# Patient Record
Sex: Female | Born: 1950 | Race: White | Hispanic: No
Health system: Midwestern US, Community
[De-identification: ages and names within clinical notes are randomized; demographics above are authoritative.]

## PROBLEM LIST (undated history)

## (undated) DIAGNOSIS — M19011 Primary osteoarthritis, right shoulder: Secondary | ICD-10-CM

## (undated) DIAGNOSIS — R52 Pain, unspecified: Secondary | ICD-10-CM

## (undated) DIAGNOSIS — Z1231 Encounter for screening mammogram for malignant neoplasm of breast: Secondary | ICD-10-CM

## (undated) DIAGNOSIS — M810 Age-related osteoporosis without current pathological fracture: Secondary | ICD-10-CM

## (undated) DIAGNOSIS — G250 Essential tremor: Secondary | ICD-10-CM

## (undated) DIAGNOSIS — R251 Tremor, unspecified: Secondary | ICD-10-CM

## (undated) DIAGNOSIS — M797 Fibromyalgia: Secondary | ICD-10-CM

## (undated) DIAGNOSIS — B009 Herpesviral infection, unspecified: Secondary | ICD-10-CM

## (undated) DIAGNOSIS — F419 Anxiety disorder, unspecified: Secondary | ICD-10-CM

## (undated) DIAGNOSIS — F32A Depression, unspecified: Secondary | ICD-10-CM

## (undated) DIAGNOSIS — F329 Major depressive disorder, single episode, unspecified: Secondary | ICD-10-CM

## (undated) DIAGNOSIS — K5090 Crohn's disease, unspecified, without complications: Secondary | ICD-10-CM

## (undated) HISTORY — PX: COLON RESECTION: SHX5231

---

## 2009-02-15 LAB — HM DEXA SCAN

## 2009-02-15 LAB — HM MAMMOGRAPHY

## 2009-04-28 ENCOUNTER — Inpatient Hospital Stay (HOSPITAL_COMMUNITY): Admission: EM | Admit: 2009-04-28 | Discharge: 2009-05-09 | Payer: Self-pay | Admitting: Emergency Medicine

## 2009-04-29 ENCOUNTER — Encounter (INDEPENDENT_AMBULATORY_CARE_PROVIDER_SITE_OTHER): Payer: Self-pay | Admitting: Internal Medicine

## 2010-05-11 LAB — CBC
HCT: 27.1 % — ABNORMAL LOW (ref 36.0–46.0)
HCT: 27.8 % — ABNORMAL LOW (ref 36.0–46.0)
HCT: 28.9 % — ABNORMAL LOW (ref 36.0–46.0)
HCT: 29.5 % — ABNORMAL LOW (ref 36.0–46.0)
HCT: 31 % — ABNORMAL LOW (ref 36.0–46.0)
Hemoglobin: 10.8 g/dL — ABNORMAL LOW (ref 12.0–15.0)
Hemoglobin: 9.3 g/dL — ABNORMAL LOW (ref 12.0–15.0)
Hemoglobin: 9.5 g/dL — ABNORMAL LOW (ref 12.0–15.0)
Hemoglobin: 9.6 g/dL — ABNORMAL LOW (ref 12.0–15.0)
MCHC: 33.5 g/dL (ref 30.0–36.0)
MCHC: 34.1 g/dL (ref 30.0–36.0)
MCHC: 34.1 g/dL (ref 30.0–36.0)
MCHC: 34.3 g/dL (ref 30.0–36.0)
MCHC: 34.3 g/dL (ref 30.0–36.0)
MCHC: 34.4 g/dL (ref 30.0–36.0)
MCHC: 34.7 g/dL (ref 30.0–36.0)
MCHC: 34.9 g/dL (ref 30.0–36.0)
MCV: 85.3 fL (ref 78.0–100.0)
MCV: 85.3 fL (ref 78.0–100.0)
MCV: 85.8 fL (ref 78.0–100.0)
MCV: 86 fL (ref 78.0–100.0)
MCV: 86.5 fL (ref 78.0–100.0)
MCV: 86.5 fL (ref 78.0–100.0)
MCV: 86.6 fL (ref 78.0–100.0)
MCV: 86.6 fL (ref 78.0–100.0)
MCV: 87.7 fL (ref 78.0–100.0)
Platelets: 372 10*3/uL (ref 150–400)
Platelets: 496 10*3/uL — ABNORMAL HIGH (ref 150–400)
Platelets: 670 10*3/uL — ABNORMAL HIGH (ref 150–400)
Platelets: 670 10*3/uL — ABNORMAL HIGH (ref 150–400)
Platelets: 673 10*3/uL — ABNORMAL HIGH (ref 150–400)
Platelets: 685 10*3/uL — ABNORMAL HIGH (ref 150–400)
RBC: 3.2 MIL/uL — ABNORMAL LOW (ref 3.87–5.11)
RBC: 3.31 MIL/uL — ABNORMAL LOW (ref 3.87–5.11)
RBC: 3.34 MIL/uL — ABNORMAL LOW (ref 3.87–5.11)
RBC: 3.52 MIL/uL — ABNORMAL LOW (ref 3.87–5.11)
RBC: 3.62 MIL/uL — ABNORMAL LOW (ref 3.87–5.11)
RBC: 3.66 MIL/uL — ABNORMAL LOW (ref 3.87–5.11)
RDW: 13.7 % (ref 11.5–15.5)
RDW: 13.7 % (ref 11.5–15.5)
RDW: 13.9 % (ref 11.5–15.5)
RDW: 13.9 % (ref 11.5–15.5)
RDW: 13.9 % (ref 11.5–15.5)
RDW: 14 % (ref 11.5–15.5)
RDW: 14.1 % (ref 11.5–15.5)
RDW: 14.2 % (ref 11.5–15.5)
RDW: 14.3 % (ref 11.5–15.5)
WBC: 11.3 10*3/uL — ABNORMAL HIGH (ref 4.0–10.5)
WBC: 13.6 10*3/uL — ABNORMAL HIGH (ref 4.0–10.5)
WBC: 15.3 10*3/uL — ABNORMAL HIGH (ref 4.0–10.5)
WBC: 18.2 10*3/uL — ABNORMAL HIGH (ref 4.0–10.5)
WBC: 22.7 10*3/uL — ABNORMAL HIGH (ref 4.0–10.5)
WBC: 25.5 10*3/uL — ABNORMAL HIGH (ref 4.0–10.5)

## 2010-05-11 LAB — BASIC METABOLIC PANEL
BUN: 1 mg/dL — ABNORMAL LOW (ref 6–23)
BUN: 2 mg/dL — ABNORMAL LOW (ref 6–23)
BUN: 2 mg/dL — ABNORMAL LOW (ref 6–23)
BUN: 5 mg/dL — ABNORMAL LOW (ref 6–23)
BUN: 5 mg/dL — ABNORMAL LOW (ref 6–23)
BUN: 5 mg/dL — ABNORMAL LOW (ref 6–23)
CO2: 19 mEq/L (ref 19–32)
CO2: 22 mEq/L (ref 19–32)
CO2: 23 mEq/L (ref 19–32)
CO2: 23 mEq/L (ref 19–32)
CO2: 24 mEq/L (ref 19–32)
CO2: 24 mEq/L (ref 19–32)
CO2: 25 mEq/L (ref 19–32)
Calcium: 7.6 mg/dL — ABNORMAL LOW (ref 8.4–10.5)
Calcium: 8.3 mg/dL — ABNORMAL LOW (ref 8.4–10.5)
Calcium: 8.7 mg/dL (ref 8.4–10.5)
Calcium: 8.7 mg/dL (ref 8.4–10.5)
Chloride: 106 mEq/L (ref 96–112)
Chloride: 107 mEq/L (ref 96–112)
Chloride: 107 mEq/L (ref 96–112)
Chloride: 108 mEq/L (ref 96–112)
Chloride: 110 mEq/L (ref 96–112)
Creatinine, Ser: 0.64 mg/dL (ref 0.4–1.2)
Creatinine, Ser: 0.65 mg/dL (ref 0.4–1.2)
Creatinine, Ser: 0.72 mg/dL (ref 0.4–1.2)
Creatinine, Ser: 0.75 mg/dL (ref 0.4–1.2)
Creatinine, Ser: 0.95 mg/dL (ref 0.4–1.2)
GFR calc Af Amer: >60 mL/min (ref >60)
GFR calc Af Amer: >60 mL/min (ref >60)
GFR calc Af Amer: >60 mL/min (ref >60)
GFR calc Af Amer: >60 mL/min (ref >60)
GFR calc Af Amer: >60 mL/min (ref >60)
GFR calc Af Amer: >60 mL/min (ref >60)
GFR calc non Af Amer: >60 mL/min (ref >60)
GFR calc non Af Amer: >60 mL/min (ref >60)
GFR calc non Af Amer: >60 mL/min (ref >60)
GFR calc non Af Amer: >60 mL/min (ref >60)
GFR calc non Af Amer: >60 mL/min (ref >60)
Glucose, Bld: 80 mg/dL (ref 70–99)
Glucose, Bld: 95 mg/dL (ref 70–99)
Glucose, Bld: 96 mg/dL (ref 70–99)
Glucose, Bld: 99 mg/dL (ref 70–99)
Potassium: 3.6 mEq/L (ref 3.5–5.1)
Potassium: 3.7 mEq/L (ref 3.5–5.1)
Potassium: 3.8 mEq/L (ref 3.5–5.1)
Potassium: 4 mEq/L (ref 3.5–5.1)
Potassium: 4.1 mEq/L (ref 3.5–5.1)
Potassium: 5.2 mEq/L — ABNORMAL HIGH (ref 3.5–5.1)
Sodium: 137 mEq/L (ref 135–145)
Sodium: 138 mEq/L (ref 135–145)
Sodium: 140 mEq/L (ref 135–145)
Sodium: 141 mEq/L (ref 135–145)
Sodium: 141 mEq/L (ref 135–145)
Sodium: 142 mEq/L (ref 135–145)

## 2010-05-11 LAB — FERRITIN: Ferritin: 362 ng/mL — ABNORMAL HIGH (ref 10–291)

## 2010-05-11 LAB — DIFFERENTIAL
Basophils Absolute: 0 10*3/uL (ref 0.0–0.1)
Basophils Absolute: 0 10*3/uL (ref 0.0–0.1)
Basophils Absolute: 0 10*3/uL (ref 0.0–0.1)
Basophils Absolute: 0.1 10*3/uL (ref 0.0–0.1)
Basophils Absolute: 0.1 10*3/uL (ref 0.0–0.1)
Basophils Relative: 0 % (ref 0–1)
Basophils Relative: 0 % (ref 0–1)
Basophils Relative: 0 % (ref 0–1)
Basophils Relative: 0 % (ref 0–1)
Basophils Relative: 1 % (ref 0–1)
Eosinophils Absolute: 0 10*3/uL (ref 0.0–0.7)
Eosinophils Absolute: 0 10*3/uL (ref 0.0–0.7)
Eosinophils Absolute: 0.1 10*3/uL (ref 0.0–0.7)
Eosinophils Absolute: 0.2 10*3/uL (ref 0.0–0.7)
Eosinophils Absolute: 0.2 10*3/uL (ref 0.0–0.7)
Eosinophils Absolute: 0.3 10*3/uL (ref 0.0–0.7)
Eosinophils Absolute: 0.3 10*3/uL (ref 0.0–0.7)
Eosinophils Absolute: 0.3 10*3/uL (ref 0.0–0.7)
Eosinophils Relative: 0 % (ref 0–5)
Eosinophils Relative: 1 % (ref 0–5)
Eosinophils Relative: 1 % (ref 0–5)
Eosinophils Relative: 1 % (ref 0–5)
Lymphocytes Relative: 10 % — ABNORMAL LOW (ref 12–46)
Lymphocytes Relative: 11 % — ABNORMAL LOW (ref 12–46)
Lymphocytes Relative: 6 % — ABNORMAL LOW (ref 12–46)
Lymphocytes Relative: 7 % — ABNORMAL LOW (ref 12–46)
Lymphs Abs: 0.8 10*3/uL (ref 0.7–4.0)
Lymphs Abs: 0.8 10*3/uL (ref 0.7–4.0)
Lymphs Abs: 1.9 10*3/uL (ref 0.7–4.0)
Lymphs Abs: 2.5 10*3/uL (ref 0.7–4.0)
Lymphs Abs: 2.7 10*3/uL (ref 0.7–4.0)
Lymphs Abs: 3.2 10*3/uL (ref 0.7–4.0)
Metamyelocytes Relative: 7 %
Monocytes Absolute: 1.3 10*3/uL — ABNORMAL HIGH (ref 0.1–1.0)
Monocytes Absolute: 1.5 10*3/uL — ABNORMAL HIGH (ref 0.1–1.0)
Monocytes Absolute: 1.9 10*3/uL — ABNORMAL HIGH (ref 0.1–1.0)
Monocytes Absolute: 2.5 10*3/uL — ABNORMAL HIGH (ref 0.1–1.0)
Monocytes Relative: 6 % (ref 3–12)
Monocytes Relative: 7 % (ref 3–12)
Monocytes Relative: 9 % (ref 3–12)
Myelocytes: 8 %
Neutro Abs: 11 10*3/uL — ABNORMAL HIGH (ref 1.7–7.7)
Neutro Abs: 12.2 10*3/uL — ABNORMAL HIGH (ref 1.7–7.7)
Neutro Abs: 20.9 10*3/uL — ABNORMAL HIGH (ref 1.7–7.7)
Neutro Abs: 22.5 10*3/uL — ABNORMAL HIGH (ref 1.7–7.7)
Neutrophils Relative %: 72 % (ref 43–77)
Neutrophils Relative %: 77 % (ref 43–77)
Neutrophils Relative %: 79 % — ABNORMAL HIGH (ref 43–77)
Neutrophils Relative %: 81 % — ABNORMAL HIGH (ref 43–77)
Neutrophils Relative %: 82 % — ABNORMAL HIGH (ref 43–77)
Neutrophils Relative %: 89 % — ABNORMAL HIGH (ref 43–77)
nRBC: 0 /100 WBC

## 2010-05-11 LAB — CULTURE, BLOOD (ROUTINE X 2): Culture: NO GROWTH

## 2010-05-11 LAB — COMPREHENSIVE METABOLIC PANEL
ALT: 28 U/L (ref 0–35)
AST: 10 U/L (ref 0–37)
Albumin: 2.3 g/dL — ABNORMAL LOW (ref 3.5–5.2)
Alkaline Phosphatase: 144 U/L — ABNORMAL HIGH (ref 39–117)
Calcium: 8.6 mg/dL (ref 8.4–10.5)
Chloride: 108 mEq/L (ref 96–112)
GFR calc non Af Amer: 30 mL/min — ABNORMAL LOW (ref >60)
Glucose, Bld: 112 mg/dL — ABNORMAL HIGH (ref 70–99)
Potassium: 3.6 mEq/L (ref 3.5–5.1)
Sodium: 136 mEq/L (ref 135–145)

## 2010-05-11 LAB — EHEC TOXIN BY EIA, STOOL

## 2010-05-11 LAB — URINALYSIS, ROUTINE W REFLEX MICROSCOPIC
Bilirubin Urine: NEGATIVE
Bilirubin Urine: NEGATIVE
Glucose, UA: NEGATIVE mg/dL
Hgb urine dipstick: NEGATIVE
Ketones, ur: NEGATIVE mg/dL
Ketones, ur: NEGATIVE mg/dL
Nitrite: NEGATIVE
Protein, ur: 100 mg/dL — AB
Protein, ur: NEGATIVE mg/dL
Specific Gravity, Urine: 1.007 (ref 1.005–1.030)
Urobilinogen, UA: 0.2 mg/dL (ref 0.0–1.0)

## 2010-05-11 LAB — RETICULOCYTES
RBC.: 3.38 MIL/uL — ABNORMAL LOW (ref 3.87–5.11)
Retic Count, Absolute: 20.3 10*3/uL (ref 19.0–186.0)
Retic Ct Pct: 0.6 % (ref 0.4–3.1)

## 2010-05-11 LAB — STOOL CULTURE

## 2010-05-11 LAB — POCT CARDIAC MARKERS
CKMB, poc: 6.8 ng/mL (ref 1.0–8.0)
Myoglobin, poc: >500 ng/mL (ref 12–200)

## 2010-05-11 LAB — VITAMIN B12: Vitamin B-12: >2000 pg/mL — ABNORMAL HIGH (ref 211–911)

## 2010-05-11 LAB — BRAIN NATRIURETIC PEPTIDE
Pro B Natriuretic peptide (BNP): 749 pg/mL — ABNORMAL HIGH (ref 0.0–100.0)
Pro B Natriuretic peptide (BNP): 888 pg/mL — ABNORMAL HIGH (ref 0.0–100.0)

## 2010-05-11 LAB — URINE CULTURE

## 2010-05-11 LAB — CLOSTRIDIUM DIFFICILE EIA

## 2010-05-11 LAB — GIARDIA/CRYPTOSPORIDIUM SCREEN(EIA)

## 2010-05-11 LAB — T4, FREE: Free T4: 1 ng/dL (ref 0.80–1.80)

## 2010-05-11 LAB — IRON AND TIBC

## 2010-05-11 LAB — FOLATE: Folate: >20 ng/mL

## 2010-05-11 LAB — POCT I-STAT, CHEM 8
BUN: 58 mg/dL — ABNORMAL HIGH (ref 6–23)
Chloride: 106 mEq/L (ref 96–112)
Sodium: 128 mEq/L — ABNORMAL LOW (ref 135–145)

## 2010-05-11 LAB — VANCOMYCIN, TROUGH: Vancomycin Tr: 15 ug/mL (ref 10.0–20.0)

## 2010-05-11 LAB — MAGNESIUM: Magnesium: 1.5 mg/dL (ref 1.5–2.5)

## 2010-05-11 LAB — TROPONIN I: Troponin I: 0.06 ng/mL (ref 0.00–0.06)

## 2010-05-11 LAB — URINE MICROSCOPIC-ADD ON

## 2013-04-19 ENCOUNTER — Encounter

## 2013-04-19 LAB — VITAMIN B12: Vitamin B12: 197 pg/mL (ref 193–986)

## 2013-04-19 LAB — METABOLIC PANEL, BASIC
Anion gap: 9 mmol/L (ref 7–16)
BUN: 16 MG/DL (ref 8–23)
CO2: 27 mmol/L (ref 21–32)
Calcium: 8.8 MG/DL (ref 8.3–10.4)
Chloride: 102 mmol/L (ref 98–107)
Creatinine: 0.9 MG/DL (ref 0.6–1.5)
GFR est AA: >60 mL/min/{1.73_m2} (ref >60)
GFR est non-AA: >60 mL/min/{1.73_m2} (ref >60)
Glucose: 92 mg/dL (ref 65–100)
Potassium: 4.2 mmol/L (ref 3.5–5.1)
Sodium: 138 mmol/L (ref 136–145)

## 2013-04-19 LAB — CBC WITH AUTOMATED DIFF
ABS. BASOPHILS: 0.1 10*3/uL (ref 0.0–0.2)
ABS. EOSINOPHILS: 0.2 10*3/uL (ref 0.0–0.8)
ABS. LYMPHOCYTES: 2.9 10*3/uL (ref 0.5–4.6)
ABS. MONOCYTES: 0.6 10*3/uL (ref 0.1–1.3)
ABS. NEUTROPHILS: 4.9 10*3/uL (ref 1.7–8.2)
BASOPHILS: 1 % (ref 0.0–2.0)
EOSINOPHILS: 2 % (ref 0.5–7.8)
HCT: 40.3 % (ref 35.8–46.3)
HGB: 13.3 g/dL (ref 11.7–15.4)
LYMPHOCYTES: 33 % (ref 13–44)
MCH: 28.7 PG (ref 26.1–32.9)
MCHC: 33 g/dL (ref 31.4–35.0)
MCV: 86.9 FL (ref 79.6–97.8)
MONOCYTES: 7 % (ref 4.0–12.0)
MPV: 8.3 FL — ABNORMAL LOW (ref 10.8–14.1)
NEUTROPHILS: 57 % (ref 43–78)
PLATELET: 291 10*3/uL (ref 150–450)
RBC: 4.64 M/uL (ref 4.05–5.25)
RDW: 12.8 % (ref 11.9–14.6)
WBC: 8.6 10*3/uL (ref 4.3–11.1)

## 2013-04-19 LAB — FOLATE: Folate: 17.6 ng/mL — ABNORMAL HIGH (ref 3.1–17.5)

## 2013-04-19 LAB — LIPID PANEL
CHOL/HDL Ratio: 2.8
Cholesterol, total: 205 MG/DL — ABNORMAL HIGH (ref <200)
HDL Cholesterol: 72 MG/DL — ABNORMAL HIGH (ref 40–60)
LDL, calculated: 75.2 MG/DL (ref <100)
Triglyceride: 289 MG/DL — ABNORMAL HIGH (ref 35–150)
VLDL, calculated: 57.8 MG/DL — ABNORMAL HIGH (ref 7.0–27.0)

## 2013-04-19 LAB — FERRITIN: Ferritin: 74 NG/ML (ref 8–388)

## 2013-04-19 LAB — ALT: ALT (SGPT): 33 U/L (ref 12–65)

## 2013-04-19 LAB — SED RATE, AUTOMATED: Sed rate, automated: 17 mm/hr

## 2013-04-24 LAB — METHYLMALONIC ACID: Methylmalonic acid: 597 nmol/L — ABNORMAL HIGH (ref 0–378)

## 2013-07-11 LAB — HM COLONOSCOPY

## 2013-07-16 NOTE — Progress Notes (Signed)
Bone density scan was done today with out complications

## 2013-07-31 MED ORDER — SALINE PERIPHERAL FLUSH PRN
INTRAMUSCULAR | Status: DC | PRN
Start: 2013-07-31 — End: 2013-08-04
  Administered 2013-07-31 (×2)

## 2013-07-31 MED ADMIN — zoledronic acid (RECLAST) IVPB 5 mg: INTRAVENOUS | @ 18:00:00 | NDC 23155018631

## 2013-07-31 MED FILL — ZOLEDRONIC ACID 5 MG/100 ML IN MANNITOL & WATER IV: 5 mg/100 mL | INTRAVENOUS | Qty: 100

## 2013-07-31 NOTE — Progress Notes (Addendum)
Pt arrived ambulatory to receive first dose of reclast.  Education/handout provided on medication administration/possible side effects.  Pt verbalized understanding.  PIV initiated in right arm with positive BR noted.  Reclast infused over 15 minutes.  Pt monitored following infusion.  No reaction noted.  PIV d/c'd.  Pt discharged ambulatory.  Notified pt to call Dr. Janalyn ShyBray's office with any questions/concerns.

## 2013-08-23 NOTE — Progress Notes (Signed)
Darlene Cardenas, M.D.   Colon and Rectal Surgeon  [facility]  [address], [address]  [place], [place] [address]  Phone: [phone] Fax: [phone]      Consult Note  08/23/2013    Darlene Cardenas  MRN: [id number]    Requesting Provider: Dr Carolyne Littles, MD    Primary Care Physician: Newton Pigg, MD    Reason for Consult:  Fecal incontinence    HISTORY OF PRESENT ILLNESS:   Darlene Cardenas is a 63 y.o. year old female sent to me in consultation for evaluation of fecal incontinence.  The patient states that she has been having problems with stool leakage and seepage for the last 3 years.  She states that the problem is gradually worsening.  Currently, she denies incontinence to solid stool.  When the stool gets a little looser, or "mushy," she will leak this consistency every day area.  She reports leaking liquids a few times per week and denies any incontinence to flatus.  She wears pads and changes them once or twice a day.  She states that she is not in a romantic relationship because of the incontinence.  She has had 2 children, both born C-section.  She denies any vaginal surgery.  She denies anal intercourse or anal rape.  She has had multiple incision and drainage procedures of perianal abscesses.  She had a "Parks procedure" in 1983 to help minimize the risk of future abscesses.  She states she was told at the time of that operation that it may eventually have some degree of fecal leakage.    She also has a history of Crohn's disease, diagnosed in 12.   She had what sounds like a total of four ileocolic resections for the Crohn's.  She had not been on any Crohn's medication since 1999.  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  She states that after that exam, she was told to avoid all fiber foods.  She was also started on Rifaximin  as part of a clinical trial for her Crohn's.  The medication has made her stool slightly more formed, and her incontinence has improved slightly.    She currently reports 3-4 bowel movements daily.  She denies straining.  She denies a sense of incomplete evacuation.  Time on the commode 5 min. Or less.  She denies pain with passage of stool.  She reports rare blood in the stool, which she feels is related to hemorrhoids.  She has some intermittent perianal itching and burning.  She has chronic abdominal pain, and has had it for many years, related to Crohn's disease.  No nausea/vomiting.  She is not using fiber supplement.  No stool softeners, laxatives, or emesis.  No family history of colorectal cancer or Lynch syndrome related cancers.  Her father had ulcerative colitis as well as a history of colon polyps.    REVIEW OF SYSTEMS:   Constitutional: No fevers/chills, +weakness, +fatigue, no lightheadedness, no night sweats, +insomnia, no appetite changes, +weight changes, +memory problems.   Eyes: No changes in vision, no diplopia, no tearing, no scotomata, no pain   Ears/Nose/Throat/Mouth:  +change in hearing, +tinnitus, no bleeding, no epistaxis, no nasal discharge, +sinusitis.   Respiratory: No cough, no dyspnea, no wheezing, no hemoptysis, no sleep apnea   Cardiovascular: No chest pains, no chest pressure, no chest tightness, no palpitations   Gastrointestinal: +abdominal pains,  no bowel habit changes, no nausea, no emesis, no hematochezia, no melena, no cramping, no constipation, no diarrhea, +incontinence, no jaundice, no diverticulosis. Otherwise per HPI   Genitourinary: No dysuria, no hematuria, no urinary frequency, no nocturia, no recent UTIs   Musculoskeletal: No back/neck pain, no arthritis, no joint pain/swelling, +muscle pains   Skin: No rashes, no itching, no ulcers   Hematologic:  +easy bruising, +anemia   Neurologic: No headaches, no dizziness, no seizures    Psychiatric: No depressive symptoms, no anxiety symptoms, no changes in mood, no substance abuse     PAST MEDICAL HISTORY:  HLD, bipolar disorder, Crohn's disease, bilateral cataracts, fibromyalgia, RLS, family history of colon polyps in father, myocardial bridge    PAST SURGICAL HISTORY:  Ileocolic resection x3-4 (1980, 1995, 1999, other?), "Parks Procedure" 1983, LOA 1996, c-section x2, bilateral cataracts, left TKA 2007, Multiple perianal I&Ds 1979-1983, hysterectomy, colonoscopy 07/2013    ALLERGIES: No Known Allergies    HOME MEDICATIONS:   ??? Calcium-Cholecalciferol, D3, (CALCIUM 600 WITH VITAMIN D3) 600 mg(1,500mg ) -400 unit cap Take  by mouth.   ??? cyanocobalamin (VITAMIN B-12) 1,000 mcg tablet Take 1,000 mcg by mouth daily.   ??? gabapentin (GRALISE) 600 mg Tb24 Take  by mouth.   ??? propranolol LA (INDERAL LA) 120 mg SR capsule Take 120 mg by mouth daily.   ??? pramipexole (MIRAPEX) 1 mg tablet Take 1 mg by mouth three (3) times daily.   ??? oxyCODONE IR (ROXICODONE) 10 mg tab immediate release tablet Take  by mouth.   ??? valproate (DEPAKENE) 250 mg/5 mL syrup Take 250 mg by mouth three (3) times daily.   ??? traMADol (ULTRAM) 50 mg tablet Take 50 mg by mouth every six (6) hours as needed for Pain.     SOCIAL HISTORY: Lives in [place].  Married.  She has 2 children and 5 grandchildren.  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  She is currently on disability.  Nonsmoker.  Drinks 1 alcoholic beverage per week.     PREVENTION: last colonoscopy 07/2013.???Kim--no polyps    FAMILY HISTORY: No colon or rectal cancer.  Father with a history of polyps.  No  prostate, ovary, endometrial, gastric, or pancreatic cancers.  Maternal aunt had breast cancer.  Father had ulcerative colitis.   Mother and father with hypertension.  Father with diabetes, stroke, diverticular disease.  Mother with kidney disease.    PHYSICAL EXAM:  Blood pressure 136/76, pulse 76, height 5\' 4"  (1.626 m), weight 145 lb  (65.772 kg).  Body mass index is 24.88 kg/(m^2).  The patient was evaluated in the presence of a medical assistant  General: Normotensive, in no acute distress, well developed, well nourished appearing   Head:  AT/NC, no lesions   Eyes:  PERRLA, EOM's full, conjunctivae clear   Neck:  Supple, no masses, no lymphadenopathy, no thyromegaly, no carotid bruits   Chest:  Lungs clear, no rales, no rhonchi, no wheezes   Heart:  RR, no murmurs, no rubs, no gallops   Abdomen:  Soft, minimal diffuse, nonspecific tenderness, no rebound, no guarding, no masses, nondistended.  Well healed vertical midline incision.   Back:  Normal curvature, no tenderness.  Negative Murphy's punch.   Extremities:  FROM, no edema, no erythema.  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   Neuro:  Physiologic, oriented x3, full affect, no localizing findings   Skin:  Normal, no rashes, no lesions noted.  Rectal: External exam shows no fissures, no fistula openings, no erythema, and no abscesses.  There is a scar/dimple on the right buttock consistent with previous fistula site.  There is mild to moderate perianal excoriation, mostly posteriorly around the anus.  There is a relative anterior sparing.  Touching the area recreates some of her pain. No significant external hemorrhoids.  Superior gluteal cleft reveals no pits or signs suggestive of prior pilonidal disease.  Palpation of the perianal tissues and ischiorectal fossae shows no tenderness or fluctuance.  Valsalva reveals no prolapse of tissue.  Anal wink is present.  The anus is closed.    DRE shows high/elevated resting tone and high/elevated voluntary squeeze.  Valsalva reveals appropriate relaxation.  No presacral masses.  Rectocele is not present.  Stool in the vault is absent.  Stool is heme negative, grossly.  Anoscopy was performed to evaluate the internal hemorrhoids.   No fissures, no fistula openings, no distal anorectal polyps or masses.   No significant internal hemorrhoidal disease.  Nothing is irritated, inflamed, prolapsing, thrombosed, or bleeding..   I cannot see any scars consistent with her multiple I&D's or with the "Parks procedure"    Office note from Dr. Selena BattenKim reviewed    ASSESSMENT:  Fecal incontinence  Tendency towards diarrhea  Rectal bleeding  Crohn's disease  Chronic abdominal pain, due to above  Family history of colon polyps and father    RECOMMENDATIONS:  --I told the patient that I was somewhat surprised that she has incontinence to the degree that she does.  Her sphincter tone is actually very good and very strong.   I do not believe the sphincter is to blame for her incontinence.  --I would recommend the first thing we did was try to thicken her stool a little bit.  If we got her away from the liquid and mushy stool consistency and made her bowel movements more solid, I believe she would have less issues with incontinence.  I gave her a handout with multiple suggestions on fiber supplementation, including Metamucil, Benefiber, Fiber One products, and Fiber gummies.  --She states that she was told to avoid all fiber because of the appearance of her colon on colonoscopy.  She plans to call Dr. Selena BattenKim and discuss my thoughts with her before adding any fiber to her diet.  --additionally, the only "Parks procedure" I know of is the eponymous name for a proctocolectomy with J-pouch.  I did not appreciate that anatomy today, but the colonoscopy/endoscopy report should clarify that.  --I'll get a copy of the last colonoscopy note as well    --given the appearance of the perianal excoriation, I gave her recommendations on the perianal skin care and cleaning  --The patient may clean with either plain paper, baby wipes, or a shower after having a bowel movement.  Gentle cleansing is the most important point here.  --After cleaning, she needs to make sure that the perianal skin is dry.   She can use plain paper, a hair dryer set on low/no heat, or a gentle cloth to pat dry.  --She should then use barrier cream such as Desitin purple or Aquaphor on the perianal skin.  --Finally, she should place a gauze or cottonball between the buttocks.  I explained that the gauze acts to keep the barrier cream in place and keep the undergarments clean, minimize friction trauma between the buttocks, as well as absorb any small amount of liquid that comes from the anus.    --We discussed  the anatomy and physiology of the anal canal, the muscles of continence, the anal sampling reflex, and normal physiology, using pictures from anatomy atlases and textbooks.    --I explained to her that on the spectrum of incontinence, she has relatively mild symptoms, with infrequent accidents and significant amount of stool.  --I gave her a fecal incontinence symptoms tracker, and asked her to fill it out and return it to me.  --I provided a handout on home Kegel exercises, and explained how to do them.    --I talked to her about anal physiology testing to better understand the structure and function of her muscle.  I described the procedures of anal manometry and ultrasound to her.  I explained that they will give [place] more objective information about her sphincter complex and will help guide therapy recommendations.  At this time, she would like to hold off on the testing and consider it in the future    --I then spent a good amount of time explaining the treatment options:   * the first and least invasive would be to follow conservatively.  This has no risks, but her symptoms may progress.   * the second least invasive option would be referral to pelvic floor physical therapy.  This has the benefit of helping her train and strengthen her pelvic floor muscles, and she will likely gain some improvement in her control.   This is a slow process, without guaranteed benefits.    * next in terms of invasiveness would be Solesta injection.  This is a minor office procedure with small risks.  I explained to her that the studies related to this medication have shown at least 50% of people will improve by 50%, and that a second injection is frequently used.  Given her resting tone, and this has little role in her.   * sacral nerve stimulation would be another option.  This is more invasive than the above injection, as it would potentially require 2 trips to the operating room, and placement of a nerve stimulator in her spine.  Studies have shown it to be more effective than Solesta injection, but also has increased risks associated with more invasive procedures.  It has the benefit of a built in trial, initially after the stimulator wire is placed, and before the permanent generator is implanted.  If there is no improvement during the trial, the wire was removed.   * sphincteroplasty is an option if a sphincter defect is noted on manometry and ultrasound.  This is a more invasive procedure, with significant amount of postop recovery pain, and a significant perianal wound that needs to heal.   I told her that initial results from this procedure are very good, but that there is deterioration of the effects to the point where only 50% of patients have good results at 5 years postop.   * the most radical option would be a colostomy.  This will completely divert the stool, and she will have no fecal incontinent episodes per anus.   * finally, I told her that there are several options that I do not offer.  There is a radiofrequency treatment of the sphincter complex.   There are artificial bowel sphincters, which can be implanted.  There is research going on currently regarding a magnetic anal sphincter.      --she would like to try the dietary changes and Kegel exercises.   --We will schedule a followup visit for her in a month or 2  to discuss symptoms and make further plans.        Fax communication sent to Dr Selena Batten and Jason Fila.      Greater than 50% of this 60 minute visit was spent on counseling, describing the relevant anatomy/phyisiology, the nonoperative and operative therapies, and the risks/benefits of each.       Darlene Knudsen, MD  08/23/2013  6:31 PM    Elements of this note have been created with speech-recognition software.  As a result, errors in speech recognition may have occurred.

## 2013-10-04 NOTE — Telephone Encounter (Signed)
I called the patient tonight to check on her.  She is a 63 year old female who I met 08/23/13 for evaluation of fecal incontinence.  She has a history of Crohn's disease and had multiple perianal abscesses,, and eventually a procedure involving dissection around the sphincter to help eliminate future abscesses.  She also has a history of several ileocolic resections.  When we met, her stool consistency was relatively loose, and I talked to her about trying to thicken that, in hopes of improving her fecal incontinence.  This, unfortunately, had to be balanced with a stricture at her ileocolic anastomosis, seen on colonoscopy.    Since seeing me, she has been taking Rifaxamin as part of a study for her Crohn's.  She states that it seems like it is helping a lot, and her leaking is much better, but not completely resolved.  she is very happy with her current situation.  She has started taking fiber gummies, and they may be contributing to her improvement as well.  I had talked to her about making a followup appointment when she saw me last month, but she does not have a follow up appointment made.  She stated that at this time, she does not think she needs an appointment, and prefers to call and schedule a followup on an as???needed basis.  I will be happy to see her in the future, if needed.    Robinette Haines, MD

## 2013-10-23 MED ORDER — ZOLEDRONIC ACID 5 MG/100 ML IN MANNITOL & WATER IV
5 mg/100 mL | Freq: Once | INTRAVENOUS | Status: AC
Start: 2013-10-23 — End: 2013-10-23

## 2013-10-23 NOTE — Progress Notes (Signed)
PROGRESS NOTE    SUBJECTIVE:   Darlene Cardenas is a 63 y.o. female seen for a follow up visit regarding Follow-up    Follow-up  The history is provided by the patient. This is a chronic problem.     .  Past Medical History, Past Surgical History, Family history, Social History, and Medications were all reviewed with the patient today and updated as necessary.       Current Outpatient Prescriptions   Medication Sig Dispense Refill   ??? pramipexole (MIRAPEX) 1.5 mg tablet      ??? co-enzyme Q-10 (CO Q-10) 50 mg capsule      ??? CALCIUM 600 WITH VITAMIN D3 600 mg(1,500mg ) -400 unit chew      ??? mupirocin (BACTROBAN) 2 % ointment      ??? zoledronic acid (RECLAST) 5 mg/100 mL soln 100 mL by IntraVENous route once for 1 dose. 100 mL 0   ??? primidone (MYSOLINE) 50 mg tablet      ??? GRALISE 600 mg Tb24      ??? cyanocobalamin (VITAMIN B-12) 1,000 mcg tablet Take 1,000 mcg by mouth daily.     ??? propranolol LA (INDERAL LA) 120 mg SR capsule Take 120 mg by mouth daily.     ??? oxyCODONE IR (ROXICODONE) 10 mg tab immediate release tablet Take  by mouth.       No Known Allergies  Patient Active Problem List   Diagnosis Code   ??? Myocardial bridge 746.85   ??? Bipolar disorder (HCC) 296.80   ??? Essential tremor 333.1   ??? Fibromyalgia 729.1   ??? Crohn's disease (HCC) 555.9   ??? HLD (hyperlipidemia) 272.4   ??? Vitamin B12 deficiency anemia 281.1   ??? Restless legs syndrome (RLS) 333.94   ??? Esophageal motility disorder 530.5   ??? Osteoporosis 733.00   ??? Insomnia 780.52   ??? Chronic neck pain 723.1, 338.29   ??? Unspecified vitamin D deficiency 268.9     Past Medical History   Diagnosis Date   ??? Restless legs syndrome (RLS) 07/17/2013   ??? 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 Bipolar disorder (HCC) 07/17/2013   ??? Vitamin B12 deficiency anemia 07/17/2013   ??? Essential tremor 07/17/2013   ??? Fibromyalgia 07/17/2013   ??? Crohn's disease (HCC) 07/17/2013   ??? HLD (hyperlipidemia) 07/17/2013   ??? Esophageal motility disorder    ??? Esophageal stricture    ??? Osteoporosis     ??? Webbing of the fingers or toes    ??? Insomnia    ??? Pruritus of scalp    ??? Chronic neck pain    ??? Unspecified vitamin D deficiency      Past Surgical History   Procedure Laterality Date   ??? Hx colectomy  1992     with appendectomy after I&D of many perirectal abcesses   ??? Hx knee replacement Left 2008   ??? Hx cataract removal Bilateral 2014   ??? Hx appendectomy     ??? Hx colonoscopy     ??? Hx gyn     ??? Hx open reduction internal fixation Left 2009     hip     Family History   Problem Relation Age of Onset   ??? Elevated Lipids Mother    ??? Hypertension Mother    ??? Stroke Father      History   Substance Use Topics   ??? Smoking status: Never Smoker    ??? Smokeless tobacco: Not on file   ??? Alcohol  Use: Yes      Comment: occasional         Review of Systems   Musculoskeletal: Positive for back pain.   Neurological: Negative for speech difficulty.   Psychiatric/Behavioral: Positive for sleep disturbance. Negative for dysphoric mood.         OBJECTIVE:  BP 130/80 mmHg   Ht  (1.626 m)   Wt 144 lb (65.318 kg)   BMI 24.71 kg/m2     Physical Exam   Constitutional: She appears well-developed and well-nourished.   Psychiatric: She has a normal mood and affect. Judgment normal.       Medical problems and test results were reviewed with the patient today.     No results found for this or any previous visit (from the past 672 hour(s)).      ASSESSMENT and PLAN    Autie was seen today for follow-up.    Diagnoses and associated orders for this visit:    Bipolar disorder, in full remission, most recent episode mixed (HCC)    Chronic neck pain    Crohn's disease, without complications (HCC)    Osteoporosis  - zoledronic acid (RECLAST) 5 mg/100 mL soln; 100 mL by IntraVENous route once for 1 dose.    HLD (hyperlipidemia)    Fibromyalgia    Esophageal motility disorder    Vitamin B12 deficiency anemia    Unspecified vitamin D deficiency    Restless legs syndrome (RLS)            Follow-up Disposition:   Return in about 4 months (around 02/22/2014) for [facility].

## 2013-10-23 NOTE — Patient Instructions (Signed)
No longer seeing cardiology, sees Dr Selena Batten for Crohn's and esophageal problems, has Mammogram order but has not gone

## 2014-02-21 ENCOUNTER — Encounter: Attending: Internal Medicine

## 2014-03-27 ENCOUNTER — Encounter: Payer: MEDICARE | Attending: Family

## 2014-04-25 ENCOUNTER — Ambulatory Visit: Admit: 2014-04-25 | Discharge: 2014-04-25 | Payer: MEDICARE | Attending: Family

## 2014-04-25 DIAGNOSIS — Z0000 Encounter for general adult medical examination without abnormal findings: Secondary | ICD-10-CM

## 2014-04-25 MED ORDER — DIPHTH,PERTUSSIS(ACEL),TETANUS 2.5 LF UNIT-8 MCG-5 LF/0.5ML IM SYRINGE
Freq: Once | INTRAMUSCULAR | Status: AC
Start: 2014-04-25 — End: 2014-04-25

## 2014-04-25 NOTE — Progress Notes (Signed)
This is a Subsequent Medicare Annual Wellness Visit providing Personalized Prevention Plan Services (PPPS)     I have reviewed the patient's medical history in detail and updated the computerized patient record.     History     Past Medical History   Diagnosis Date   ??? Restless legs syndrome (RLS) 07/17/2013   ??? 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 Bipolar disorder (HCC) 07/17/2013   ??? Vitamin B12 deficiency anemia 07/17/2013   ??? Essential tremor 07/17/2013   ??? Fibromyalgia 07/17/2013   ??? Crohn's disease (HCC) 07/17/2013   ??? HLD (hyperlipidemia) 07/17/2013   ??? Esophageal motility disorder    ??? Esophageal stricture    ??? Osteoporosis    ??? Webbing of the fingers or toes    ??? Insomnia    ??? Pruritus of scalp    ??? Chronic neck pain    ??? Unspecified vitamin D deficiency       Past Surgical History   Procedure Laterality Date   ??? Hx colectomy  1992     with appendectomy after I&D of many perirectal abcesses   ??? Hx knee replacement Left 2008   ??? Hx cataract removal Bilateral 2014   ??? Hx appendectomy     ??? Hx colonoscopy     ??? Hx gyn     ??? Hx open reduction internal fixation Left 2009     hip     Current Outpatient Prescriptions   Medication Sig Dispense Refill   ??? clindamycin (CLEOCIN T) 1 % external solution Apply  to affected area two (2) times daily as needed.  3   ??? clobetasol (TEMOVATE) 0.05 % external solution Apply  to affected area two (2) times daily as needed.  3   ??? GRALISE 300 mg Tb24 Take 1 Tab by mouth daily.     ??? pramipexole (MIRAPEX) 1.5 mg tablet Take 1.5 mg by mouth daily.     ??? cyanocobalamin, vitamin B-12, 5,000 mcg subl 1 Tab by SubLINGual route daily.     ??? Diphth, Pertus,Acell,, Tetanus (BOOSTRIX TDAP) 2.5-8-5 Lf-mcg-Lf/0.74mL syrg 0.5 mL by IntraMUSCular route once for 1 dose. 1 Vial 0   ??? CALCIUM 600 WITH VITAMIN D3 600 mg(1,500mg ) -400 unit chew Take 1 Tab by mouth daily.     ??? mupirocin (BACTROBAN) 2 % ointment Apply  to affected area daily as needed.      ??? primidone (MYSOLINE) 50 mg tablet Take 50 mg by mouth daily.     ??? GRALISE 600 mg Tb24 Take 1 Tab by mouth daily.     ??? propranolol LA (INDERAL LA) 120 mg SR capsule Take 120 mg by mouth daily.     ??? oxyCODONE IR (ROXICODONE) 10 mg tab immediate release tablet Take 10 mg by mouth every six (6) hours as needed.       No Known Allergies  Family History   Problem Relation Age of Onset   ??? Elevated Lipids Mother    ??? Hypertension Mother    ??? Stroke Father      History   Substance Use Topics   ??? Smoking status: Never Smoker    ??? Smokeless tobacco: Not on file   ??? Alcohol Use: Yes      Comment: occasional     Patient Active Problem List   Diagnosis Code   ??? 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 Bipolar disorder (HCC) F31.9   ??? Essential tremor G25.0   ??? Fibromyalgia M79.7   ??? Crohn's  disease (HCC) K50.90   ??? HLD (hyperlipidemia) E78.5   ??? Vitamin B12 deficiency anemia D51.9   ??? Restless legs syndrome (RLS) G25.81   ??? Esophageal motility disorder K22.4   ??? Osteoporosis M81.0   ??? Insomnia G47.00   ??? Chronic neck pain M54.2, G89.29   ??? Vitamin D deficiency E55.9       Depression Risk Factor Screening:     PHQ 2 / 9, over the last two weeks 04/25/2014   Little interest or pleasure in doing things Not at all   Feeling down, depressed or hopeless Not at all   Total Score PHQ 2 0         Alcohol Risk Factor Screening:     History   Alcohol Use   ??? Yes     Comment: occasional          Functional Ability and Level of Safety:   Get Up and Go Test                    Pt should stand up from chair, walk 10 ft, turn around, walk back to chair.  Assistive devices permitted.   Results:   normal  The result:  American Geriatric Society defines "fail" as >30seconds  Patient been treated previously for falls?  no    Fall Risk     Fall Risk Assessment, last 12 mths 04/25/2014   Able to walk? Yes   Fall in past 12 months? No         Hearing and Vision Screening   No exam data present  Hearing Screen (Forced Whisper greater than or equal to 5 ft)    Result:  Normal  Hearing Aid:  NO      Activities of Daily Living   Self-care.   Requires assistance with: no ADLs    Abuse Screen   Patient is not abused    Advanced Care Planning   Patient has Advanced Care Planning in place?  NO    Cognitive Screener     Patient could correctly identify 3 objects relayed 3 or more minutes before recall NO.  Clock test normal  MMSE 30    Health Maintenance     Health Maintenance   Topic Date Due   ??? Hepatitis C Screening  May 24, 1950   ??? Tdap Age > 18  09/09/1968   ??? Td Q 10 Yrs Age > 18  09/09/1968   ??? PAP AKA CERVICAL CYTOLOGY  09/10/1970   ??? ZOSTER VACCINE AGE 48>  09/09/2009   ??? INFLUENZA AGE 82 TO ADULT  09/16/2014   ??? COLONOSCOPY  11/20/2014   ??? Bone Densitometry  07/17/2015   ??? BREAST CANCER SCRN MAMMOGRAM  07/17/2015     There is no immunization history for the selected administration types on file for this patient.    Review of Systems   Review of Systems      Physical Examination     Physical Exam      Advice/Referrals/Counseling   Education and counseling provided:  End-of-Life planning (with patient's consent)  Pneumococcal Vaccine  Influenza Vaccine  Tdap vaccine  Shingles vaccine  Colorectal cancer screening tests  Cardiovascular screening blood test  Bone mass measurement (DEXA)  Screening for glaucoma  Diabetes screening test  Medical nutrition therapy for individuals with diabetes or renal disease  Diabetes outpatient self-management training services  High Blood Pressure  STD/HIV screening  Overweight/Obesity  Safety and Fall Prevention  Assessment/Plan     Darlene Cardenas was seen today for annual wellness visit.    Diagnoses and all orders for this visit:    Annual physical exam  Discussed BMI and healthy weight and diet, weight bearing exercise, smoking avoidance, sun protection and medication compliance. Reviewed appropriate health maintenance screening. The patient was counseled regarding regular monthly SBE, screening procedures and recommended  schedules for immunizations, mammography, Pap smears, GI hemoccult testing, colonoscopy and BMD.  Colonoscopy next year. BMD up to date. Order for mammogram. Boostrix Rx given and Prevnar in office    Crohn's disease of small intestine without complication (HCC)  Orders:  -     CBC WITH AUTOMATED DIFF  -     METABOLIC PANEL, COMPREHENSIVE  Clinical trial Dr Selena Batten    Osteoporosis  DEXA due next year    Other hyperlipidemia  Orders:  -     LIPID PANEL WITH LDL/HDL RATIO    Vitamin D deficiency  Orders:  -     VITAMIN D, 25 HYDROXY    Other vitamin B12 deficiency anemia    Essential tremor    Need for vaccination with 13-polyvalent pneumococcal conjugate vaccine  Orders:  -     PREVNAR 13 - MEDICARE    Need for Tdap vaccination  Orders:  -     Diphth, Pertus,Acell,, Tetanus (BOOSTRIX TDAP) 2.5-8-5 Lf-mcg-Lf/0.70mL syrg; 0.5 mL by IntraMUSCular route once for 1 dose.    Memory loss  Orders:  -     TSH, 3RD GENERATION  -     VITAMIN B12  -     HEMOGLOBIN A1C  Labs today. Briefly discussed neuropsych testing but not interested at this time. MMSE 30. Can discuss further with Dr Jason Fila.     Follow-up Disposition:  Return in about 2 weeks (around 05/09/2014) for AWE 2 with Dr Jason Fila 30 min.

## 2014-04-26 LAB — CBC WITH AUTOMATED DIFF
ABS. BASOPHILS: 0.1 10*3/uL (ref 0.0–0.2)
ABS. EOSINOPHILS: 0.1 10*3/uL (ref 0.0–0.4)
ABS. IMM. GRANS.: 0 10*3/uL (ref 0.0–0.1)
ABS. MONOCYTES: 0.6 10*3/uL (ref 0.1–0.9)
ABS. NEUTROPHILS: 4.7 10*3/uL (ref 1.4–7.0)
Abs Lymphocytes: 2.3 10*3/uL (ref 0.7–3.1)
BASOPHILS: 1 %
EOSINOPHILS: 2 %
HCT: 38.5 % (ref 34.0–46.6)
HGB: 12.8 g/dL (ref 11.1–15.9)
IMMATURE GRANULOCYTES: 0 %
Lymphocytes: 30 %
MCH: 28.8 pg (ref 26.6–33.0)
MCHC: 33.2 g/dL (ref 31.5–35.7)
MCV: 87 fL (ref 79–97)
MONOCYTES: 8 %
NEUTROPHILS: 59 %
PLATELET: 280 10*3/uL (ref 150–379)
RBC: 4.44 x10E6/uL (ref 3.77–5.28)
RDW: 13.8 % (ref 12.3–15.4)
WBC: 7.8 10*3/uL (ref 3.4–10.8)

## 2014-04-26 LAB — METABOLIC PANEL, COMPREHENSIVE
A-G Ratio: 1.6 (ref 1.1–2.5)
ALT (SGPT): 15 IU/L (ref 0–32)
AST (SGOT): 17 IU/L (ref 0–40)
Albumin: 3.9 g/dL (ref 3.6–4.8)
Alk. phosphatase: 68 IU/L (ref 39–117)
BUN/Creatinine ratio: 27 — ABNORMAL HIGH (ref 11–26)
BUN: 20 mg/dL (ref 8–27)
Bilirubin, total: 0.2 mg/dL (ref 0.0–1.2)
CO2: 24 mmol/L (ref 18–29)
Calcium: 9.5 mg/dL (ref 8.7–10.3)
Chloride: 102 mmol/L (ref 97–108)
Creatinine: 0.74 mg/dL (ref 0.57–1.00)
GFR est AA: 99 mL/min/{1.73_m2} (ref >59)
GFR est non-AA: 86 mL/min/{1.73_m2} (ref >59)
GLOBULIN, TOTAL: 2.4 g/dL (ref 1.5–4.5)
Glucose: 98 mg/dL (ref 65–99)
Potassium: 4.2 mmol/L (ref 3.5–5.2)
Protein, total: 6.3 g/dL (ref 6.0–8.5)
Sodium: 141 mmol/L (ref 134–144)

## 2014-04-26 LAB — HEMOGLOBIN A1C WITH EAG: Hemoglobin A1c: 5.6 % (ref 4.8–5.6)

## 2014-04-26 LAB — CVD REPORT

## 2014-04-26 LAB — LIPID PANEL WITH LDL/HDL RATIO
Cholesterol, total: 199 mg/dL (ref 100–199)
HDL Cholesterol: 68 mg/dL (ref >39)
LDL, calculated: 90 mg/dL (ref 0–99)
LDL/HDL Ratio: 1.3 ratio units (ref 0.0–3.2)
Triglyceride: 207 mg/dL — ABNORMAL HIGH (ref 0–149)
VLDL, calculated: 41 mg/dL — ABNORMAL HIGH (ref 5–40)

## 2014-04-26 LAB — VITAMIN D, 25 HYDROXY: VITAMIN D, 25-HYDROXY: 18.9 ng/mL — ABNORMAL LOW (ref 30.0–100.0)

## 2014-04-26 LAB — VITAMIN B12: Vitamin B12: 1110 pg/mL — ABNORMAL HIGH (ref 211–946)

## 2014-04-26 LAB — TSH 3RD GENERATION: TSH: 1.21 u[IU]/mL (ref 0.450–4.500)

## 2014-04-26 NOTE — Progress Notes (Signed)
Quick Note:        Can discuss at scheduled office visit with Dr Jason FilaBray 3/28    ______

## 2014-05-13 ENCOUNTER — Ambulatory Visit: Admit: 2014-05-13 | Discharge: 2014-05-13 | Payer: MEDICARE | Attending: Internal Medicine

## 2014-05-13 DIAGNOSIS — E559 Vitamin D deficiency, unspecified: Secondary | ICD-10-CM

## 2014-05-13 NOTE — Progress Notes (Signed)
PROGRESS NOTE    SUBJECTIVE:   Darlene Cardenas is a 64 y.o. female seen for a follow up visit regarding Annual Wellness Visit    Fatigue  The history is provided by the patient. This is a chronic problem. The problem has been gradually worsening. Relieved by: just restarted depakene for depression.   Cholesterol Problem  The history is provided by the nursing home and patient. This is a chronic problem. The problem occurs daily. The symptoms are relieved by medications.       Past Medical History, Past Surgical History, Family history, Social History, and Medications were all reviewed with the patient today and updated as necessary.       Current Outpatient Prescriptions   Medication Sig Dispense Refill   ??? valproic acid (DEPAKENE) 250 mg capsule Take 250 mg by mouth two (2) times a day.     ??? clindamycin (CLEOCIN T) 1 % external solution Apply  to affected area two (2) times daily as needed.  3   ??? clobetasol (TEMOVATE) 0.05 % external solution Apply  to affected area two (2) times daily as needed.  3   ??? GRALISE 300 mg Tb24 Take 1 Tab by mouth daily.     ??? cyanocobalamin, vitamin B-12, 5,000 mcg subl 1 Tab by SubLINGual route daily.     ??? CALCIUM 600 WITH VITAMIN D3 600 mg(1,545m) -400 unit chew Take 1 Tab by mouth daily.     ??? primidone (MYSOLINE) 50 mg tablet Take 50 mg by mouth daily.     ??? GRALISE 600 mg Tb24 Take 1 Tab by mouth daily.     ??? propranolol LA (INDERAL LA) 120 mg SR capsule Take 120 mg by mouth daily.     ??? oxyCODONE IR (ROXICODONE) 10 mg tab immediate release tablet Take 10 mg by mouth three (3) times daily.     ??? mupirocin (BACTROBAN) 2 % ointment Apply  to affected area daily as needed.       No Known Allergies  Patient Active Problem List   Diagnosis Code   ??? 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 Bipolar disorder ([place] F31.9   ??? Essential tremor G25.0   ??? Fibromyalgia M79.7   ??? Crohn's disease ([place] K50.90   ??? HLD (hyperlipidemia) E78.5   ??? Vitamin B12 deficiency anemia D51.9    ??? Restless legs syndrome (RLS) G25.81   ??? Esophageal motility disorder K22.4   ??? Osteoporosis M81.0   ??? Insomnia G47.00   ??? Chronic neck pain M54.2, G89.29   ??? Vitamin D deficiency E55.9   ??? Chronic fatigue R53.82   ??? Thyroid disease E07.9   ??? Esophageal dysmotility K22.4     Past Medical History   Diagnosis Date   ??? Restless legs syndrome (RLS) 07/17/2013   ??? 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 Bipolar disorder ([place] 07/17/2013   ??? Vitamin B12 deficiency anemia 07/17/2013   ??? Essential tremor 07/17/2013   ??? Fibromyalgia 07/17/2013   ??? Crohn's disease ([place] 07/17/2013   ??? HLD (hyperlipidemia) 07/17/2013   ??? Esophageal motility disorder    ??? Esophageal stricture    ??? Osteoporosis    ??? Webbing of the fingers or toes    ??? Insomnia    ??? Pruritus of scalp    ??? Chronic neck pain    ??? Unspecified vitamin D deficiency      Past Surgical History   Procedure Laterality Date   ??? Hx colectomy  1992  with appendectomy after I&D of many perirectal abcesses   ??? Hx knee replacement Left 2008   ??? Hx cataract removal Bilateral 2014   ??? Hx appendectomy     ??? Hx colonoscopy     ??? Hx gyn     ??? Hx open reduction internal fixation Left 2009     hip     Family History   Problem Relation Age of Onset   ??? Elevated Lipids Mother    ??? Hypertension Mother    ??? Stroke Father      History   Substance Use Topics   ??? Smoking status: Never Smoker    ??? Smokeless tobacco: Not on file   ??? Alcohol Use: Yes      Comment: occasional         Review of Systems   Constitutional: Positive for fatigue.   Gastrointestinal: Positive for diarrhea.        On new med from Dr Maudie Mercury for Crohn's disease, has helped some with diarrhea but now maybe a little worse> Has some swallowing difficulties but has not needed a dilatation recently   Genitourinary: Negative for pelvic pain.   Musculoskeletal: Positive for neck pain.   Psychiatric/Behavioral: Positive for dysphoric mood.         OBJECTIVE:  BP 120/80 mmHg   Ht 5' 4" (1.626 m)   Wt 154 lb (69.854 kg)   BMI 26.42 kg/m2      Physical Exam   Constitutional: She appears well-developed and well-nourished.   HENT:   Right Ear: External ear normal.   Left Ear: External ear normal.   Mouth/Throat: Oropharynx is clear and moist.   Neck: Carotid bruit is not present.   Thyroid feels a little irregular but nontender   Cardiovascular: Normal rate, regular rhythm and normal pulses.    Pulmonary/Chest: Effort normal and breath sounds normal. Right breast exhibits no mass, no skin change and no tenderness. Left breast exhibits no mass, no skin change and no tenderness.   Abdominal: Soft. There is no hepatosplenomegaly. There is no tenderness.   Midline abd scar   Lymphadenopathy:     She has no cervical adenopathy.     She has no axillary adenopathy.   Neurological: She is alert. Gait normal.   Skin: Skin is warm and dry.   Seeing dermatology now for scalp rash   Psychiatric: She has a normal mood and affect. Her speech is normal.       Medical problems and test results were reviewed with the patient today.     Recent Results (from the past 672 hour(s))   CBC WITH AUTOMATED DIFF    Collection Time: 04/25/14  9:20 AM   Result Value Ref Range    WBC 7.8 3.4 - 10.8 x10E3/uL    RBC 4.44 3.77 - 5.28 x10E6/uL    HGB 12.8 11.1 - 15.9 g/dL    HCT 38.5 34.0 - 46.6 %    MCV 87 79 - 97 fL    MCH 28.8 26.6 - 33.0 pg    MCHC 33.2 31.5 - 35.7 g/dL    RDW 13.8 12.3 - 15.4 %    PLATELET 280 150 - 379 x10E3/uL    NEUTROPHILS 59 %    Lymphocytes 30 %    MONOCYTES 8 %    EOSINOPHILS 2 %    BASOPHILS 1 %    ABS. NEUTROPHILS 4.7 1.4 - 7.0 x10E3/uL    Abs Lymphocytes 2.3 0.7 - 3.1 x10E3/uL  ABS. MONOCYTES 0.6 0.1 - 0.9 x10E3/uL    ABS. EOSINOPHILS 0.1 0.0 - 0.4 x10E3/uL    ABS. BASOPHILS 0.1 0.0 - 0.2 x10E3/uL    IMMATURE GRANULOCYTES 0 %    ABS. IMM. GRANS. 0.0 0.0 - 0.1 [id number]/GX   METABOLIC PANEL, COMPREHENSIVE    Collection Time: 04/25/14  9:20 AM   Result Value Ref Range    Glucose 98 65 - 99 mg/dL    BUN 20 8 - 27 mg/dL    Creatinine 0.74 0.57 - 1.00 mg/dL     GFR est non-AA 86 >59 mL/min/1.73    GFR est AA 99 >59 mL/min/1.73    BUN/Creatinine ratio 27 (H) 11 - 26    Sodium 141 134 - 144 mmol/L    Potassium 4.2 3.5 - 5.2 mmol/L    Chloride 102 97 - 108 mmol/L    CO2 24 18 - 29 mmol/L    Calcium 9.5 8.7 - 10.3 mg/dL    Protein, total 6.3 6.0 - 8.5 g/dL    Albumin 3.9 3.6 - 4.8 g/dL    GLOBULIN, TOTAL 2.4 1.5 - 4.5 g/dL    A-G Ratio 1.6 1.1 - 2.5    Bilirubin, total 0.2 0.0 - 1.2 mg/dL    Alk. phosphatase 68 39 - 117 IU/L    AST 17 0 - 40 IU/L    ALT 15 0 - 32 IU/L   TSH, 3RD GENERATION    Collection Time: 04/25/14  9:20 AM   Result Value Ref Range    TSH 1.210 0.450 - 4.500 uIU/mL   VITAMIN D, 25 HYDROXY    Collection Time: 04/25/14  9:20 AM   Result Value Ref Range    VITAMIN D, 25-HYDROXY 18.9 (L) 30.0 - 100.0 ng/mL   VITAMIN B12    Collection Time: 04/25/14  9:20 AM   Result Value Ref Range    Vitamin B12 1110 (H) 211 - 946 pg/mL   HEMOGLOBIN A1C    Collection Time: 04/25/14  9:20 AM   Result Value Ref Range    Hemoglobin A1c 5.6 4.8 - 5.6 %   LIPID PANEL WITH LDL/HDL RATIO    Collection Time: 04/25/14  9:20 AM   Result Value Ref Range    Cholesterol, total 199 100 - 199 mg/dL    Triglyceride 207 (H) 0 - 149 mg/dL    HDL Cholesterol 68 >39 mg/dL    VLDL, calculated 41 (H) 5 - 40 mg/dL    LDL, calculated 90 0 - 99 mg/dL    LDL/HDL Ratio 1.3 0.0 - 3.2 ratio units   CVD REPORT    Collection Time: 04/25/14  9:20 AM   Result Value Ref Range    INTERPRETATION Note          Abia was seen today for annual wellness visit.    Diagnoses and all orders for this visit:    Vitamin D deficiency    Other insomnia    Bipolar disorder, in full remission, most recent episode mixed (HCC)    Restless legs syndrome (RLS)    Myocardial bridge    Osteoporosis  On no meds.  Chronic fatigue  Depression?  hgb and TSH normal, only back on meds for about 2 weeks  Chronic neck pain  Sees pain management  Thyroid disease  Comments:   no TSH, nodular ?  not previously noted will recheck on return     Esophageal dysmotility    Will need to take iv meds  for bones if she is willing, called to discuss

## 2014-06-07 NOTE — Telephone Encounter (Signed)
I understand about the long trip but am not willing to do the oxycodone

## 2014-06-07 NOTE — Telephone Encounter (Signed)
Pt wants to know if you will take over and monitor her medications. Her neurologist in [place] usually does this, but the 3hr drive is getting to be too much on her.

## 2014-06-10 ENCOUNTER — Encounter

## 2014-06-10 NOTE — Telephone Encounter (Signed)
Notified pt that Dr.Bray is not going to manage her oxycodone. Appointment made for pt to come in and discuss other medications.

## 2014-06-14 ENCOUNTER — Ambulatory Visit: Admit: 2014-06-14 | Discharge: 2014-06-14 | Payer: MEDICARE | Attending: Internal Medicine

## 2014-06-14 DIAGNOSIS — M25562 Pain in left knee: Secondary | ICD-10-CM

## 2014-06-14 MED ORDER — PRAMIPEXOLE 1.5 MG TAB
1.5 mg | ORAL_TABLET | Freq: Two times a day (BID) | ORAL | Status: DC
Start: 2014-06-14 — End: 2014-06-19

## 2014-06-14 NOTE — Progress Notes (Signed)
PROGRESS NOTE    SUBJECTIVE:   Darlene Cardenas is a 64 y.o. female seen for a follow up visit regarding Knee Pain    HPI Comments: She heard a crack in her left knee one day going down the steps and then sitting down in a chair noted a pop and pain and swelling ever since, Now pain going down the leg and into the foot    Knee Pain  The history is provided by the patient. This is a chronic problem. The problem has been rapidly worsening.       Past Medical History, Past Surgical History, Family history, Social History, and Medications were all reviewed with the patient today and updated as necessary.       Current Outpatient Prescriptions   Medication Sig Dispense Refill   ??? minocycline (MINOCIN, DYNACIN) 100 mg capsule Take 100 mg by mouth two (2) times a day.     ??? pramipexole (MIRAPEX) 1.5 mg tablet Take 1 Tab by mouth two (2) times a day. 180 Tab 4   ??? valproic acid (DEPAKENE) 250 mg capsule Take 250 mg by mouth two (2) times a day.     ??? clindamycin (CLEOCIN T) 1 % external solution Apply  to affected area two (2) times daily as needed.  3   ??? clobetasol (TEMOVATE) 0.05 % external solution Apply  to affected area two (2) times daily as needed.  3   ??? GRALISE 300 mg Tb24 Take 1 Tab by mouth daily.     ??? cyanocobalamin, vitamin B-12, 5,000 mcg subl 1 Tab by SubLINGual route daily.     ??? CALCIUM 600 WITH VITAMIN D3 600 mg(1,500mg ) -400 unit chew Take 1 Tab by mouth daily.     ??? mupirocin (BACTROBAN) 2 % ointment Apply  to affected area daily as needed.     ??? primidone (MYSOLINE) 50 mg tablet Take 50 mg by mouth daily.     ??? GRALISE 600 mg Tb24 Take 1 Tab by mouth daily.     ??? propranolol LA (INDERAL LA) 120 mg SR capsule Take 120 mg by mouth daily.     ??? oxyCODONE IR (ROXICODONE) 10 mg tab immediate release tablet Take 10 mg by mouth three (3) times daily.       No Known Allergies  Patient Active Problem List   Diagnosis Code   ??? 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 Bipolar disorder (HCC) F31.9   ??? Essential tremor G25.0    ??? Fibromyalgia M79.7   ??? Crohn's disease (HCC) K50.90   ??? HLD (hyperlipidemia) E78.5   ??? Vitamin B12 deficiency anemia D51.9   ??? Restless legs syndrome (RLS) G25.81   ??? Esophageal motility disorder K22.4   ??? Osteoporosis M81.0   ??? Insomnia G47.00   ??? Chronic neck pain M54.2, G89.29   ??? Vitamin D deficiency E55.9   ??? Chronic fatigue R53.82   ??? Thyroid disease E07.9   ??? Esophageal dysmotility K22.4   ??? Acute pain of left knee M25.562     Past Medical History   Diagnosis Date   ??? Restless legs syndrome (RLS) 07/17/2013   ??? 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 Bipolar disorder (HCC) 07/17/2013   ??? Vitamin B12 deficiency anemia 07/17/2013   ??? Essential tremor 07/17/2013   ??? Fibromyalgia 07/17/2013   ??? Crohn's disease (HCC) 07/17/2013   ??? HLD (hyperlipidemia) 07/17/2013   ??? Esophageal motility disorder    ??? Esophageal stricture    ??? Osteoporosis    ???  Webbing of the fingers or toes    ??? Insomnia    ??? Pruritus of scalp    ??? Chronic neck pain    ??? Unspecified vitamin D deficiency      Past Surgical History   Procedure Laterality Date   ??? Hx colectomy  1992     with appendectomy after I&D of many perirectal abcesses   ??? Hx orthopaedic Left 2008     ORIF   ??? Hx cataract removal Bilateral 2014   ??? Hx appendectomy     ??? Hx colonoscopy     ??? Hx gyn     ??? Hx open reduction internal fixation Left 2009     hip     Family History   Problem Relation Age of Onset   ??? Elevated Lipids Mother    ??? Hypertension Mother    ??? Stroke Father      History   Substance Use Topics   ??? Smoking status: Never Smoker    ??? Smokeless tobacco: Not on file   ??? Alcohol Use: Yes      Comment: occasional         Review of Systems   Musculoskeletal: Positive for arthralgias. Negative for joint swelling.         OBJECTIVE:  BP 118/78 mmHg   Ht  (1.626 m)   Wt 147 lb (66.679 kg)   BMI 25.22 kg/m2     Physical Exam   Constitutional: She appears well-developed and well-nourished.   Musculoskeletal:        Left knee: She exhibits no effusion, no deformity and no erythema.    Scar over left knee. Limited flexion at the knee   Psychiatric: She has a normal mood and affect. Her behavior is normal.       Medical problems and test results were reviewed with the patient today.     No results found for this or any previous visit (from the past 672 hour(s)).      ASSESSMENT and PLAN    Vittoria was seen today for knee pain.    Diagnoses and all orders for this visit:    Acute pain of left knee  Comments:   recent pop in her left knee now with pain on climbing stairs and decrease ROM  Orders:  -     REFERRAL TO ORTHOPEDICS    Tremor of both hands  Orders:  -     pramipexole (MIRAPEX) 1.5 mg tablet; Take 1 Tab by mouth two (2) times a day.        Follow-up Disposition: Not on File

## 2014-06-19 ENCOUNTER — Encounter

## 2014-06-19 MED ORDER — PRAMIPEXOLE 1.5 MG TAB
1.5 mg | ORAL_TABLET | Freq: Two times a day (BID) | ORAL | Status: DC
Start: 2014-06-19 — End: 2014-10-01

## 2014-06-21 ENCOUNTER — Encounter: Attending: Internal Medicine

## 2014-07-03 ENCOUNTER — Encounter

## 2014-07-03 MED ORDER — PROPRANOLOL 120 MG 24 HR SUSTAINED ACTION CAP
120 mg | ORAL_CAPSULE | Freq: Every day | ORAL | Status: DC
Start: 2014-07-03 — End: 2014-10-01

## 2014-07-03 MED ORDER — PRIMIDONE 50 MG TAB
50 mg | ORAL_TABLET | Freq: Three times a day (TID) | ORAL | Status: DC
Start: 2014-07-03 — End: 2014-10-01

## 2014-07-03 NOTE — Telephone Encounter (Signed)
rx for the primidone and the proprolol was sent into the pharmacy 90 supply with 3 refills

## 2014-09-13 ENCOUNTER — Encounter: Payer: MEDICARE | Attending: Internal Medicine

## 2014-09-17 ENCOUNTER — Ambulatory Visit: Payer: MEDICARE

## 2014-09-23 ENCOUNTER — Ambulatory Visit: Admit: 2014-09-23 | Discharge: 2014-09-23 | Payer: MEDICARE | Attending: Physician Assistant

## 2014-09-23 DIAGNOSIS — M7989 Other specified soft tissue disorders: Secondary | ICD-10-CM

## 2014-09-23 NOTE — Progress Notes (Signed)
PROGRESS NOTE    SUBJECTIVE:   Darlene Cardenas is a 64 y.o. female seen for a follow up visit regarding Ankle swelling and Foot Swelling    Leg Swelling  The history is provided by the patient. This is a new problem. The current episode started more than 1 week ago (6-7 days). The problem occurs constantly (pt states Dr. Rger changes her minocycling to clindmucin 2 week ago for her folliculitits ). The problem has been gradually worsening. Pertinent negatives include no chest pain, no abdominal pain, no headaches and no shortness of breath. Nothing aggravates the symptoms. Nothing relieves the symptoms.       Past Medical History, Past Surgical History, Family history, Social History, and Medications were all reviewed with the patient today and updated as necessary.       Current Outpatient Prescriptions   Medication Sig Dispense Refill   ??? primidone (MYSOLINE) 50 mg tablet Take 1 Tab by mouth three (3) times daily. 270 Tab 3   ??? propranolol LA (INDERAL LA) 120 mg SR capsule Take 1 Cap by mouth daily. 90 Cap 3   ??? pramipexole (MIRAPEX) 1.5 mg tablet Take 1 Tab by mouth two (2) times a day. 180 Tab 4   ??? valproic acid (DEPAKENE) 250 mg capsule Take 250 mg by mouth two (2) times a day.     ??? clindamycin (CLEOCIN T) 1 % external solution Apply  to affected area two (2) times daily as needed.  3   ??? clobetasol (TEMOVATE) 0.05 % external solution Apply  to affected area two (2) times daily as needed.  3   ??? GRALISE 300 mg Tb24 Take 1 Tab by mouth daily.     ??? cyanocobalamin, vitamin B-12, 5,000 mcg subl 1 Tab by SubLINGual route daily.     ??? CALCIUM 600 WITH VITAMIN D3 600 mg(1,500mg ) -400 unit chew Take 1 Tab by mouth daily.     ??? mupirocin (BACTROBAN) 2 % ointment Apply  to affected area daily as needed.     ??? GRALISE 600 mg Tb24 Take 1 Tab by mouth daily.     ??? oxyCODONE IR (ROXICODONE) 10 mg tab immediate release tablet Take 10 mg by mouth three (3) times daily.       No Known Allergies   Patient Active Problem List   Diagnosis Code   ??? 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 Bipolar disorder (HCC) F31.9   ??? Essential tremor G25.0   ??? Fibromyalgia M79.7   ??? Crohn's disease (HCC) K50.90   ??? HLD (hyperlipidemia) E78.5   ??? Vitamin B12 deficiency anemia D51.9   ??? Restless legs syndrome (RLS) G25.81   ??? Esophageal motility disorder K22.4   ??? Osteoporosis M81.0   ??? Insomnia G47.00   ??? Chronic neck pain M54.2, G89.29   ??? Vitamin D deficiency E55.9   ??? Chronic fatigue R53.82   ??? Thyroid disease E07.9   ??? Esophageal dysmotility K22.4   ??? Acute pain of left knee M25.562     Past Medical History   Diagnosis Date   ??? Restless legs syndrome (RLS) 07/17/2013   ??? 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 Bipolar disorder (HCC) 07/17/2013   ??? Vitamin B12 deficiency anemia 07/17/2013   ??? Essential tremor 07/17/2013   ??? Fibromyalgia 07/17/2013   ??? Crohn's disease (HCC) 07/17/2013   ??? HLD (hyperlipidemia) 07/17/2013   ??? Esophageal motility disorder    ??? Esophageal stricture    ??? Osteoporosis    ???  Webbing of the fingers or toes    ??? Insomnia    ??? Pruritus of scalp    ??? Chronic neck pain    ??? Unspecified vitamin D deficiency            Review of Systems   Constitutional: Negative for fever, fatigue and unexpected weight change.   HENT: Negative for congestion, ear pain, hearing loss, sore throat, tinnitus and voice change.    Eyes: Negative for pain and visual disturbance.   Respiratory: Negative for cough, chest tightness, shortness of breath and wheezing.    Cardiovascular: Positive for leg swelling. Negative for chest pain and palpitations.   Gastrointestinal: Negative for nausea, vomiting, abdominal pain, diarrhea and constipation.   Genitourinary: Negative for dysuria, urgency, frequency and hematuria.   Musculoskeletal: Negative for myalgias, joint swelling, arthralgias and gait problem.   Skin: Negative for color change and rash.   Neurological: Negative for dizziness, syncope, numbness and headaches.   Hematological: Negative for adenopathy.    Psychiatric/Behavioral: Negative for suicidal ideas, hallucinations, sleep disturbance and decreased concentration. The patient is not nervous/anxious.          OBJECTIVE:  BP 118/64 mmHg   Ht 5\' 4"  (1.626 m)   Wt 154 lb (69.854 kg)   BMI 26.42 kg/m2     Physical Exam   Constitutional: She is oriented to person, place, and time. She appears well-developed and well-nourished. No distress.   HENT:   Head: Normocephalic and atraumatic.   Right Ear: External ear normal.   Left Ear: External ear normal.   Nose: Nose normal.   Mouth/Throat: Oropharynx is clear and moist and mucous membranes are normal.   Eyes: Conjunctivae and EOM are normal. Right eye exhibits no discharge. Left eye exhibits no discharge. Right conjunctiva is not injected. Left conjunctiva is not injected.   Neck: Normal range of motion. Neck supple. No tracheal deviation present. No thyromegaly present.   Cardiovascular: Normal rate, regular rhythm and normal heart sounds.    No murmur heard.  Pulmonary/Chest: Effort normal and breath sounds normal. She has no wheezes. She has no rhonchi. She has no rales.   Abdominal: Soft. Bowel sounds are normal. She exhibits no distension and no mass. There is no tenderness.   Musculoskeletal: Normal range of motion. She exhibits edema (+ 1pitting edema of both legs with pain, no redness).   Neurological: She is alert and oriented to person, place, and time.   Skin: Skin is warm, dry and intact. No rash noted. No erythema.   Psychiatric: She has a normal mood and affect.       Medical problems and test results were reviewed with the patient today.     No results found for this or any previous visit (from the past 672 hour(s)).      ASSESSMENT and PLAN    Darlene Cardenas was seen today for ankle swelling and foot swelling.    Diagnoses and all orders for this visit:    Leg swelling  Will get labs and [place], may  Need to stop her clindamycin, her  Dermatologist started her on 2 week ago  Orders:  -     CBC WITH AUTOMATED DIFF   -     METABOLIC PANEL, COMPREHENSIVE  -     DUPLEX LOWER EXT VENOUS BILAT; Future          Follow-up Disposition:  Return if symptoms worsen or fail to improve, for Please keep previous appt as scheduled.

## 2014-09-23 NOTE — Telephone Encounter (Signed)
Pt called stating for 5-6 days she has been experiencing swollen ankles, calfs,puffiness,ankles are warm to the touch and painful to walk and Pt stated skin feels as if it is going to split. Pt was advised she needed to be seen. Pt was scheduled app 09/23/14 with Sital.

## 2014-09-24 ENCOUNTER — Inpatient Hospital Stay: Admit: 2014-09-24 | Payer: MEDICARE | Attending: Physician Assistant

## 2014-09-24 ENCOUNTER — Encounter: Admit: 2014-09-24 | Discharge: 2014-09-24 | Payer: MEDICARE

## 2014-09-24 DIAGNOSIS — M7989 Other specified soft tissue disorders: Secondary | ICD-10-CM

## 2014-09-24 NOTE — Telephone Encounter (Signed)
Great, please let pt know, negative [place]

## 2014-09-24 NOTE — Telephone Encounter (Signed)
Dan with radiology called and said US / DVT was negative

## 2014-09-24 NOTE — Telephone Encounter (Signed)
COMPLETED

## 2014-09-24 NOTE — Progress Notes (Signed)
Quick Note:        lEFT MSG OF NORMAL RESULTS    ______

## 2014-09-25 LAB — CBC WITH AUTOMATED DIFF
ABS. BASOPHILS: 0.1 10*3/uL (ref 0.0–0.2)
ABS. EOSINOPHILS: 0.2 10*3/uL (ref 0.0–0.4)
ABS. IMM. GRANS.: 0 10*3/uL (ref 0.0–0.1)
ABS. MONOCYTES: 0.7 10*3/uL (ref 0.1–0.9)
ABS. NEUTROPHILS: 5.8 10*3/uL (ref 1.4–7.0)
Abs Lymphocytes: 1.8 10*3/uL (ref 0.7–3.1)
BASOPHILS: 1 %
EOSINOPHILS: 2 %
HCT: 35.8 % (ref 34.0–46.6)
HGB: 11.9 g/dL (ref 11.1–15.9)
IMMATURE GRANULOCYTES: 0 %
Lymphocytes: 21 %
MCH: 28.4 pg (ref 26.6–33.0)
MCHC: 33.2 g/dL (ref 31.5–35.7)
MCV: 85 fL (ref 79–97)
MONOCYTES: 8 %
NEUTROPHILS: 68 %
PLATELET: 274 10*3/uL (ref 150–379)
RBC: 4.19 x10E6/uL (ref 3.77–5.28)
RDW: 14 % (ref 12.3–15.4)
WBC: 8.6 10*3/uL (ref 3.4–10.8)

## 2014-09-25 LAB — METABOLIC PANEL, COMPREHENSIVE
A-G Ratio: 1.7 (ref 1.1–2.5)
ALT (SGPT): 36 IU/L — ABNORMAL HIGH (ref 0–32)
AST (SGOT): 30 IU/L (ref 0–40)
Albumin: 3.8 g/dL (ref 3.6–4.8)
Alk. phosphatase: 83 IU/L (ref 39–117)
BUN/Creatinine ratio: 16 (ref 11–26)
BUN: 12 mg/dL (ref 8–27)
Bilirubin, total: 0.2 mg/dL (ref 0.0–1.2)
CO2: 24 mmol/L (ref 18–29)
Calcium: 9 mg/dL (ref 8.7–10.3)
Chloride: 104 mmol/L (ref 97–108)
Creatinine: 0.75 mg/dL (ref 0.57–1.00)
GFR est AA: 97 mL/min/{1.73_m2} (ref >59)
GFR est non-AA: 84 mL/min/{1.73_m2} (ref >59)
GLOBULIN, TOTAL: 2.3 g/dL (ref 1.5–4.5)
Glucose: 102 mg/dL — ABNORMAL HIGH (ref 65–99)
Potassium: 3.6 mmol/L (ref 3.5–5.2)
Protein, total: 6.1 g/dL (ref 6.0–8.5)
Sodium: 142 mmol/L (ref 134–144)

## 2014-10-01 ENCOUNTER — Encounter

## 2014-10-01 ENCOUNTER — Inpatient Hospital Stay: Admit: 2014-10-01 | Payer: MEDICARE

## 2014-10-01 NOTE — Progress Notes (Signed)
Patient verified name, DOB, and surgery as listed in [facility].    TYPE  CASE:lB  Orders per surgeon: were Received  Labs per surgeon:none:   Labs per anesthesia protocol: no additional   EKG  :  Denies any shortness of breath or chest pain on exertion      Patient provided with handouts including guide to surgery , transfusions, pain management and hand hygiene for the family and community. Pt verbalizes understanding of all pre-op instructions .  Instructed that family must be present in building at all times.    Hibiclens and instructions given per hospital policy.      Instructed patient to continue  previous medications as prescribed prior to surgery and  to take the following medications the day of surgery according to anesthesia guidelines : clindamycin and oxycodone if needed       Original medication prescription bottles were not visualized during patient appointment.  Prior to admission medication verified with patient verbally    Continue all previous medications unless otherwise directed.      Instructed patient to hold  the following medications prior to surgery: all vitamins and supplements 7 days prior to surgery      Patient verbalized understanding of all instructions and provided all medical/health information to the best of their ability.

## 2014-10-04 ENCOUNTER — Inpatient Hospital Stay: Payer: MEDICARE

## 2014-10-04 MED ORDER — CEFAZOLIN 1 GRAM SOLUTION FOR INJECTION
1 gram | INTRAMUSCULAR | Status: DC | PRN
Start: 2014-10-04 — End: 2014-10-04
  Administered 2014-10-04: 14:00:00 via INTRAVENOUS

## 2014-10-04 MED ORDER — SODIUM CHLORIDE 0.9 % INJECTION
25 mg/mL | INTRAMUSCULAR | Status: DC | PRN
Start: 2014-10-04 — End: 2014-10-04

## 2014-10-04 MED ORDER — SUCCINYLCHOLINE CHLORIDE 20 MG/ML INJECTION
20 mg/mL | INTRAMUSCULAR | Status: DC | PRN
Start: 2014-10-04 — End: 2014-10-04
  Administered 2014-10-04: 14:00:00 via INTRAVENOUS

## 2014-10-04 MED ORDER — LIDOCAINE (PF) 20 MG/ML (2 %) IV SYRINGE
100 mg/5 mL (2 %) | INTRAVENOUS | Status: AC
Start: 2014-10-04 — End: ?

## 2014-10-04 MED ORDER — EPINEPHRINE (PF) 1 MG/ML INJECTION
1 mg/mL ( mL) | INTRAMUSCULAR | Status: DC | PRN
Start: 2014-10-04 — End: 2014-10-04
  Administered 2014-10-04: 15:00:00

## 2014-10-04 MED ORDER — FENTANYL CITRATE (PF) 50 MCG/ML IJ SOLN
50 mcg/mL | INTRAMUSCULAR | Status: AC
Start: 2014-10-04 — End: ?

## 2014-10-04 MED ORDER — LIDOCAINE (PF) 20 MG/ML (2 %) IJ SOLN
20 mg/mL (2 %) | INTRAMUSCULAR | Status: DC | PRN
Start: 2014-10-04 — End: 2014-10-04
  Administered 2014-10-04: 14:00:00 via INTRAVENOUS

## 2014-10-04 MED ORDER — FENTANYL CITRATE (PF) 50 MCG/ML IJ SOLN
50 mcg/mL | INTRAMUSCULAR | Status: DC | PRN
Start: 2014-10-04 — End: 2014-10-04
  Administered 2014-10-04: 14:00:00 via INTRAVENOUS

## 2014-10-04 MED ORDER — OXYCODONE 5 MG TAB
5 mg | Freq: Once | ORAL | Status: DC | PRN
Start: 2014-10-04 — End: 2014-10-04

## 2014-10-04 MED ORDER — CEFAZOLIN 1 GRAM SOLUTION FOR INJECTION
1 gram | INTRAMUSCULAR | Status: AC
Start: 2014-10-04 — End: ?

## 2014-10-04 MED ORDER — FENTANYL CITRATE (PF) 50 MCG/ML IJ SOLN
50 mcg/mL | Freq: Once | INTRAMUSCULAR | Status: AC
Start: 2014-10-04 — End: 2014-10-04
  Administered 2014-10-04: 14:00:00 via INTRAVENOUS

## 2014-10-04 MED ORDER — LIDOCAINE HCL 1 % (10 MG/ML) IJ SOLN
10 mg/mL (1 %) | INTRAMUSCULAR | Status: DC | PRN
Start: 2014-10-04 — End: 2014-10-04

## 2014-10-04 MED ORDER — SUCCINYLCHOLINE CHLORIDE 20 MG/ML INJECTION
20 mg/mL | INTRAMUSCULAR | Status: AC
Start: 2014-10-04 — End: ?

## 2014-10-04 MED ORDER — ONDANSETRON (PF) 4 MG/2 ML INJECTION
4 mg/2 mL | INTRAMUSCULAR | Status: AC
Start: 2014-10-04 — End: ?

## 2014-10-04 MED ORDER — ROCURONIUM 10 MG/ML IV
10 mg/mL | INTRAVENOUS | Status: DC | PRN
Start: 2014-10-04 — End: 2014-10-04
  Administered 2014-10-04: 14:00:00 via INTRAVENOUS

## 2014-10-04 MED ORDER — LACTATED RINGERS IV
INTRAVENOUS | Status: DC
Start: 2014-10-04 — End: 2014-10-04
  Administered 2014-10-04 (×2): via INTRAVENOUS

## 2014-10-04 MED ORDER — DEXAMETHASONE SODIUM PHOSPHATE 4 MG/ML IJ SOLN
4 mg/mL | INTRAMUSCULAR | Status: DC | PRN
Start: 2014-10-04 — End: 2014-10-04
  Administered 2014-10-04: 14:00:00 via INTRAVENOUS

## 2014-10-04 MED ORDER — DEXAMETHASONE SODIUM PHOSPHATE 10 MG/ML IJ SOLN
10 mg/mL | INTRAMUSCULAR | Status: AC
Start: 2014-10-04 — End: ?

## 2014-10-04 MED ORDER — PHENYLEPHRINE 10 MG/ML INJECTION
10 mg/mL | INTRAMUSCULAR | Status: DC | PRN
Start: 2014-10-04 — End: 2014-10-04
  Administered 2014-10-04 (×2): via INTRAVENOUS

## 2014-10-04 MED ORDER — PROPOFOL 10 MG/ML IV EMUL
10 mg/mL | INTRAVENOUS | Status: AC
Start: 2014-10-04 — End: ?

## 2014-10-04 MED ORDER — DEXAMETHASONE SODIUM PHOSPHATE 4 MG/ML IJ SOLN
4 mg/mL | INTRAMUSCULAR | Status: AC
Start: 2014-10-04 — End: ?

## 2014-10-04 MED ORDER — EPHEDRINE SULFATE 50 MG/ML IJ SOLN
50 mg/mL | INTRAMUSCULAR | Status: DC | PRN
Start: 2014-10-04 — End: 2014-10-04
  Administered 2014-10-04 (×2): via INTRAVENOUS

## 2014-10-04 MED ORDER — NALOXONE 0.4 MG/ML INJECTION
0.4 mg/mL | INTRAMUSCULAR | Status: DC | PRN
Start: 2014-10-04 — End: 2014-10-04

## 2014-10-04 MED ORDER — EPINEPHRINE (PF) 1 MG/ML INJECTION
1 mg/mL ( mL) | INTRAMUSCULAR | Status: AC
Start: 2014-10-04 — End: ?

## 2014-10-04 MED ORDER — MIDAZOLAM 1 MG/ML IJ SOLN
1 mg/mL | Freq: Once | INTRAMUSCULAR | Status: AC
Start: 2014-10-04 — End: 2014-10-04
  Administered 2014-10-04: 14:00:00 via INTRAVENOUS

## 2014-10-04 MED ORDER — HYDROMORPHONE (PF) 2 MG/ML IJ SOLN
2 mg/mL | INTRAMUSCULAR | Status: DC | PRN
Start: 2014-10-04 — End: 2014-10-04

## 2014-10-04 MED ORDER — PROPOFOL 10 MG/ML IV EMUL
10 mg/mL | INTRAVENOUS | Status: DC | PRN
Start: 2014-10-04 — End: 2014-10-04
  Administered 2014-10-04: 14:00:00 via INTRAVENOUS

## 2014-10-04 MED ORDER — BUPIVACAINE-EPINEPHRINE (PF) 0.5 %-1:200,000 IJ SOLN
0.5 %-1:200,000 | INTRAMUSCULAR | Status: DC | PRN
Start: 2014-10-04 — End: 2014-10-04
  Administered 2014-10-04: 14:00:00 via PERINEURAL

## 2014-10-04 MED ORDER — ROCURONIUM 10 MG/ML IV
10 mg/mL | INTRAVENOUS | Status: AC
Start: 2014-10-04 — End: ?

## 2014-10-04 MED ORDER — FAMOTIDINE 20 MG TAB
20 mg | Freq: Once | ORAL | Status: AC
Start: 2014-10-04 — End: 2014-10-04
  Administered 2014-10-04: 13:00:00 via ORAL

## 2014-10-04 MED ORDER — LACTATED RINGERS IV
INTRAVENOUS | Status: DC
Start: 2014-10-04 — End: 2014-10-04

## 2014-10-04 MED FILL — ROCURONIUM 10 MG/ML IV: 10 mg/mL | INTRAVENOUS | Qty: 5

## 2014-10-04 MED FILL — ONDANSETRON (PF) 4 MG/2 ML INJECTION: 4 mg/2 mL | INTRAMUSCULAR | Qty: 2

## 2014-10-04 MED FILL — FENTANYL CITRATE (PF) 50 MCG/ML IJ SOLN: 50 mcg/mL | INTRAMUSCULAR | Qty: 2

## 2014-10-04 MED FILL — QUELICIN 20 MG/ML INJECTION SOLUTION: 20 mg/mL | INTRAMUSCULAR | Qty: 120

## 2014-10-04 MED FILL — LIDOCAINE (PF) 20 MG/ML (2 %) IV SYRINGE: 100 mg/5 mL (2 %) | INTRAVENOUS | Qty: 5

## 2014-10-04 MED FILL — FAMOTIDINE 20 MG TAB: 20 mg | ORAL | Qty: 1

## 2014-10-04 MED FILL — CEFAZOLIN 1 GRAM SOLUTION FOR INJECTION: 1 gram | INTRAMUSCULAR | Qty: 2

## 2014-10-04 MED FILL — PHENYLEPHRINE 10 MG/ML INJECTION: 10 mg/mL | INTRAMUSCULAR | Qty: 250

## 2014-10-04 MED FILL — EPINEPHRINE (PF) 1 MG/ML INJECTION: 1 mg/mL ( mL) | INTRAMUSCULAR | Qty: 2

## 2014-10-04 MED FILL — CEFAZOLIN 1 GRAM SOLUTION FOR INJECTION: 1 gram | INTRAMUSCULAR | Qty: 2000

## 2014-10-04 MED FILL — MIDAZOLAM 1 MG/ML IJ SOLN: 1 mg/mL | INTRAMUSCULAR | Qty: 2

## 2014-10-04 MED FILL — DEXAMETHASONE SODIUM PHOSPHATE 10 MG/ML IJ SOLN: 10 mg/mL | INTRAMUSCULAR | Qty: 1

## 2014-10-04 MED FILL — SENSORCAINE-MPF/EPINEPHRINE 0.5 %-1:200,000 INJECTION SOLUTION: 0.5 %-1:200,000 | INTRAMUSCULAR | Qty: 30

## 2014-10-04 MED FILL — QUELICIN 20 MG/ML INJECTION SOLUTION: 20 mg/mL | INTRAMUSCULAR | Qty: 10

## 2014-10-04 MED FILL — DEXAMETHASONE SODIUM PHOSPHATE 4 MG/ML IJ SOLN: 4 mg/mL | INTRAMUSCULAR | Qty: 4

## 2014-10-04 MED FILL — LIDOCAINE (PF) 20 MG/ML (2 %) IJ SOLN: 20 mg/mL (2 %) | INTRAMUSCULAR | Qty: 50

## 2014-10-04 MED FILL — EPHEDRINE SULFATE 50 MG/ML IJ SOLN: 50 mg/mL | INTRAMUSCULAR | Qty: 15

## 2014-10-04 MED FILL — DEXAMETHASONE SODIUM PHOSPHATE 4 MG/ML IJ SOLN: 4 mg/mL | INTRAMUSCULAR | Qty: 1

## 2014-10-04 MED FILL — PROPOFOL 10 MG/ML IV EMUL: 10 mg/mL | INTRAVENOUS | Qty: 20

## 2014-10-04 MED FILL — PROPOFOL 10 MG/ML IV EMUL: 10 mg/mL | INTRAVENOUS | Qty: 120

## 2014-10-04 NOTE — Anesthesia Procedure Notes (Signed)
Peripheral Block    Start time: 10/04/2014 9:33 AM  End time: 10/04/2014 9:35 AM  Performed by: Leonette MonarchSIMRIL, Danial Hlavac R  Authorized by: Leonette MonarchSIMRIL, Braxen Dobek R       Pre-procedure:   Indications: at surgeon's request and post-op pain management    Preanesthetic Checklist: patient identified, risks and benefits discussed, site marked, timeout performed, anesthesia consent given and patient being monitored    Timeout Time: 09:33          Block Type:   Block Type:  Interscalene  Laterality:  Right  Monitoring:  Standard ASA monitoring, responsive to questions, continuous pulse ox, oxygen, frequent vital sign checks and heart rate  Injection Technique:  Single shot  Procedures: ultrasound guided and nerve stimulator    Patient Position: seated  Prep: chlorhexidine    Location:  Interscalene  Needle Type:  Stimuplex  Needle Gauge:  22 G  Needle Localization:  Ultrasound guidance and nerve stimulator  Motor Response: minimal motor response >0.4 mA    Medication Injected:  0.5%  bupivacaine  Adds:  Epi 1:200K  Volume (mL):  30    Assessment:  Number of attempts:  1  Injection Assessment:  Incremental injection every 5 mL, negative aspiration for CSF, no paresthesia, ultrasound image on chart, local visualized surrounding nerve on ultrasound, negative aspiration for blood and no intravascular symptoms  Patient tolerance:  Patient tolerated the procedure well with no immediate complications

## 2014-10-04 NOTE — Op Note (Signed)
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    POSTOPERATIVE DIAGNOSES   1. Degenerative arthritis of the acromioclavicular joint.   2. Inflammatory changes of the glenohumeral joint.   3. Impingement of the right shoulder.   4. Small bursal tear of the right rotator cuff.    OPERATION PERFORMED   1. Arthroscopic synovectomy of the right shoulder.   2. Arthroscopic resection of the distal clavicle.   3. Subacromial decompression.   4. Debridement of bursal tear of the rotator cuff.    SURGEON: Brock Ra, MD    ANESTHESIA: General.    ASSISTANT: Lemar Lofty, CST, First Assistant    PROCEDURE: After an adequate level of general anesthesia was   obtained, the right shoulder was prepped and draped in the usual   sterile fashion. She had good motion of the shoulder. A block   per Anesthesia for postop pain management, received antibiotics.   The joint was distended posteriorly with saline. She had   somewhat of a cloudy appearance in the joint, nothing to suggest   any infectious process, but she had a large collection of   crystals mainly on the glenoid face and some on the humeral head   that was more consistent with an inflammatory process such as   gout or pseudogout. A debridement was performed, synovectomy   through the anterior portal. The biceps was intact. There was   some fraying, but she did not need a biceps tenotomy. The   arthroscope was then placed in the subacromial space and a   lateral portal was made. The patient had a moderate degree of   hooking of the acromion and a decompression was performed with   the shaver and the bur. The patient did have marked degenerative   changes noted on the distal clavicle, and again an almost   crystal-like inflammatory process. Her  rotator cuff was intact,   but she had some bursal tearing, which was debrided with the   shaver. Overall, she had good integrity, just a small bursal   tear. She had a lot of inflammation of the [facility] joint. The soft   tissue was removed and then the bone was exposed. The   osteophytes were removed initially from the undersurface from   the lateral portal. Then, with pressure applied superiorly, the   bur was placed through the anterior and also posterior portals   to remove more of the superior bone. Also, bone was removed from   the acromial side to provide more room at the [facility] joint. The   wound was then copiously irrigated. Sterile dressings applied   after closure with Monocryl and Steri-Strips. Tolerated the   procedure well. Arm was placed in a sling.        Brock Ra, MD      JRV / LE   D:  10/04/2014   11:09   T:  10/04/2014   11:28   Job #:  [id number]

## 2014-10-04 NOTE — Anesthesia Pre-Procedure Evaluation (Signed)
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 anginal pain  No changes in her CP  Denies SOB or changes in functional status   GI/Hepatic/Renal     GERD (Daily symptoms, untreated): poorly controlled          Comments: Crohn's Endo/Other      Hypothyroidism: well controlled  Arthritis     Other Findings   Comments: Esophageal stricture           Physical Exam    Airway  Mallampati: II  TM Distance: 4 - 6 cm  Neck ROM: normal range of motion   Mouth opening: Normal     Cardiovascular    Rhythm: regular  Rate: normal         Dental    Dentition: Caps/crowns and Poor dentition     Pulmonary  Breath sounds clear to auscultation               Abdominal  GI exam deferred       Other Findings            Anesthetic Plan    ASA: 2  Anesthesia type: general      Post-op pain plan if not by surgeon: peripheral nerve block single    Induction: Intravenous  Anesthetic plan and risks discussed with: Patient      GETA

## 2014-10-04 NOTE — Anesthesia Post-Procedure Evaluation (Signed)
Post-Anesthesia Evaluation and Assessment    Patient: Darlene Cardenas MRN: [id number]  SSN: [id number]    Date of Birth: [birth year]  Age: 64 y.o.  Sex: female       Cardiovascular Function/Vital Signs  Visit Vitals   ??? BP 155/68   ??? Pulse 83   ??? Temp 36.5 ??C (97.7 ??F)   ??? Resp 16   ??? Ht 5\' 4"  (1.626 m)   ??? Wt 67.1 kg (148 lb)   ??? SpO2 (!) 87%   ??? BMI 25.4 kg/m2       Patient is status post general anesthesia for Procedure(s):  RIGHT SHOULDER ARTHROSCOPY DISTAL CLAVICLE RESECTION/ SUBACHROMIAL DECOMPRESSION.    Nausea/Vomiting: None    Postoperative hydration reviewed and adequate.    Pain:  Pain Scale 1: Numeric (0 - 10) (10/04/14 1117)  Pain Intensity 1: 0 (10/04/14 1117)   Managed    Neurological Status:   Neuro (WDL): Exceptions to WDL (10/04/14 1117)  Neuro  Neurologic State: Drowsy (10/04/14 1117)   At baseline    Mental Status and Level of Consciousness: Alert and oriented     Pulmonary Status:   Room air  Adequate oxygenation and airway patent    Complications related to anesthesia: None    Post-anesthesia assessment completed. No concerns. Pt doing well.    Signed By: Dola ArgyleJames R. Stelios Kirby, MD     October 04, 2014

## 2014-10-04 NOTE — Op Note (Signed)
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    POSTOPERATIVE DIAGNOSES   1. Degenerative arthritis of the acromioclavicular joint.   2. Inflammatory changes of the glenohumeral joint.   3. Impingement of the right shoulder.   4. Small bursal tear of the right rotator cuff.    OPERATION PERFORMED   1. Arthroscopic synovectomy of the right shoulder.   2. Arthroscopic resection of the distal clavicle.   3. Subacromial decompression.   4. Debridement of bursal tear of the rotator cuff.    SURGEON: Brock Ra, MD    ANESTHESIA: General.    ASSISTANT: Lemar Lofty, CST, First Assistant    PROCEDURE: After an adequate level of general anesthesia was   obtained, the right shoulder was prepped and draped in the usual   sterile fashion. She had good motion of the shoulder. A block   per Anesthesia for postop pain management, received antibiotics.   The joint was distended posteriorly with saline. She had   somewhat of a cloudy appearance in the joint, nothing to suggest   any infectious process, but she had a large collection of   crystals mainly on the glenoid face and some on the humeral head   that was more consistent with an inflammatory process such as   gout or pseudogout. A debridement was performed, synovectomy   through the anterior portal. The biceps was intact. There was   some fraying, but she did not need a biceps tenotomy. The   arthroscope was then placed in the subacromial space and a   lateral portal was made. The patient had a moderate degree of   hooking of the acromion and a decompression was performed with   the shaver and the bur. The patient did have marked degenerative   changes noted on the distal clavicle, and again an almost    crystal-like inflammatory process. Her rotator cuff was intact,   but she had some bursal tearing, which was debrided with the   shaver. Overall, she had good integrity, just a small bursal   tear. She had a lot of inflammation of the [facility] joint. The soft   tissue was removed and then the bone was exposed. The   osteophytes were removed initially from the undersurface from   the lateral portal. Then, with pressure applied superiorly, the   bur was placed through the anterior and also posterior portals   to remove more of the superior bone. Also, bone was removed from   the acromial side to provide more room at the [facility] joint. The   wound was then copiously irrigated. Sterile dressings applied   after closure with Monocryl and Steri-Strips. Tolerated the   procedure well. Arm was placed in a sling.        Brock Ra, MD      JRV / LE   D:  10/04/2014   11:09   T:  10/04/2014   11:28   Job #:  [id number]

## 2014-10-04 NOTE — Brief Op Note (Signed)
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]  Sprain of right rotator cuff capsule, initial encounter [S43.421A]  Postoperative Diagnosis: Right shoulder AC joint arthritis, impingement    Procedure(s):  RIGHT SHOULDER ARTHROSCOPY DISTAL CLAVICLE RESECTION/ SUBACHROMIAL DECOMPRESSION  Surgeon(s) and Role:     * Brock RaJohn R Fergus Throne, MD - Primary            Surgical Staff:  Circ-1: Inocente Sallesharla T Candler, RN  Circ-Relief: Mervin KungSally C Hall, RN  Scrub Tech-1: Dolores LoryMarcy Jo Lawter  Scrub Tech-2: Mathews RobinsonsMelissa K Compton  Event Time In   Incision Start 1034   Incision Close      Anesthesia: General   Estimated Blood Loss:     Specimens: * No specimens in log *   Findings:      Complications:       Implants: * No implants in log *

## 2014-10-04 NOTE — Anesthesia Procedure Notes (Signed)
Peripheral Block    Start time: 10/04/2014 9:33 AM  End time: 10/04/2014 9:35 AM  Performed by: Cherity Blickenstaff R  Authorized by: Copelyn Widmer R       Pre-procedure:   Indications: at surgeon's request and post-op pain management    Preanesthetic Checklist: patient identified, risks and benefits discussed, site marked, timeout performed, anesthesia consent given and patient being monitored    Timeout Time: 09:33          Block Type:   Block Type:  Interscalene  Laterality:  Right  Monitoring:  Standard ASA monitoring, responsive to questions, continuous pulse ox, oxygen, frequent vital sign checks and heart rate  Injection Technique:  Single shot  Procedures: ultrasound guided and nerve stimulator    Patient Position: seated  Prep: chlorhexidine    Location:  Interscalene  Needle Type:  Stimuplex  Needle Gauge:  22 G  Needle Localization:  Ultrasound guidance and nerve stimulator  Motor Response: minimal motor response >0.4 mA    Medication Injected:  0.5%  bupivacaine  Adds:  Epi 1:200K  Volume (mL):  30    Assessment:  Number of attempts:  1  Injection Assessment:  Incremental injection every 5 mL, negative aspiration for CSF, no paresthesia, ultrasound image on chart, local visualized surrounding nerve on ultrasound, negative aspiration for blood and no intravascular symptoms  Patient tolerance:  Patient tolerated the procedure well with no immediate complications

## 2014-10-04 NOTE — H&P (Signed)
Outpatient Surgery History and Physical:  Darlene Cardenas was seen and examined.    CHIEF COMPLAINT:    r shjoulder .     PE:   There were no vitals taken for this visit.    Heart:   Regular rhythm      Lungs:  Are clear      Past Medical History:    Patient Active Problem List    Diagnosis   ??? Acute pain of left knee   ??? Chronic fatigue   ??? Thyroid disease     no TSH, nodular ?  not previously noted will recheck on return     ??? Esophageal dysmotility   ??? Esophageal motility disorder   ??? Osteoporosis   ??? Insomnia   ??? Chronic neck pain   ??? Vitamin D deficiency   ??? Myocardial bridge   ??? Bipolar disorder (HCC)     Last episode several years ago off meds for now. Sees Dr Chrisandra Netters in [facility] for restless legs, pain management, bipolar, neurologist     ??? Essential tremor   ??? Fibromyalgia   ??? Crohn's disease (HCC)   ??? HLD (hyperlipidemia)   ??? Vitamin B12 deficiency anemia   ??? Restless legs syndrome (RLS)       Surgical History:   Past Surgical History   Procedure Laterality Date   ??? Hx cataract removal Bilateral 2014   ??? Hx appendectomy     ??? Hx colonoscopy     ??? Hx open reduction internal fixation Left 2009     hip   ??? Hx colectomy  1992     with appendectomy after I&D of many perirectal abcesses   ??? Hx colectomy       colectomy x 4 total   ??? Hx cesarean section       x2   ??? Hx hysterectomy     ??? Hx orthopaedic Left 2008     ORIF knee   ??? Hx other surgical  1983     "Park Procedure" procedure to stop development of abcess       Social History: Patient  reports that she has never smoked. She does not have any smokeless tobacco history on file. She reports that she drinks alcohol.    Family History:   Family History   Problem Relation Age of Onset   ??? Elevated Lipids Mother    ??? Hypertension Mother    ??? Stroke Father        Allergies: Reviewed per EMR  No Known Allergies    Medications:    No current facility-administered medications on file prior to encounter.       Current Outpatient Prescriptions on File Prior to Encounter   Medication Sig   ??? clobetasol (TEMOVATE) 0.05 % external solution Apply  to affected area two (2) times daily as needed.   ??? GRALISE 300 mg Tb24 Take 1 Tab by mouth nightly.   ??? cyanocobalamin, vitamin B-12, 5,000 mcg subl 1 Tab by SubLINGual route daily.   ??? CALCIUM 600 WITH VITAMIN D3 600 mg(1,500mg ) -400 unit chew Take 1 Tab by mouth nightly.   ??? GRALISE 600 mg Tb24 Take 1 Tab by mouth nightly.   ??? oxyCODONE IR (ROXICODONE) 10 mg tab immediate release tablet Take 10 mg by mouth three (3) times daily.       The surgery is planned for the  Shoulder .        History and physical has been  reviewed. The patient has been examined. There have been no significant clinical changes since the completion of the originally dated History and Physical.  Patient identified by surgeon; surgical site was confirmed by patient and surgeon.      The patient is here today for outpatient surgery. I have examined the patient, no changes are noted in the patient's medical status. Necessity for the procedure/care is still present and the history and physical above is current.  See the office notes for the full long term history of the problem.  Please see the recent office notes for the musculoskeletal examination.    Signed By: Brock Ra, MD     October 04, 2014 6:57 AM

## 2014-10-14 ENCOUNTER — Ambulatory Visit: Admit: 2014-10-14 | Discharge: 2014-10-14 | Payer: MEDICARE | Attending: Internal Medicine

## 2014-10-14 DIAGNOSIS — K5000 Crohn's disease of small intestine without complications: Secondary | ICD-10-CM

## 2014-10-14 MED ORDER — ZOLEDRONIC ACID 5 MG/100 ML IN MANNITOL & WATER IV
5 mg/100 mL | Freq: Once | INTRAVENOUS | 0 refills | Status: AC
Start: 2014-10-14 — End: 2014-10-14

## 2014-10-14 NOTE — Progress Notes (Signed)
PROGRESS NOTE    SUBJECTIVE:   Darlene Cardenas is a 64 y.o. female seen for a follow up visit regarding Hypertension (discuss labs ); Cholesterol Problem; and Neck Pain (pt wants to know if she needs a rx for a neck brace)    Hypertension    This is a chronic problem. Associated symptoms include neck pain. Pertinent negatives include no palpitations and no anxiety.   Cholesterol Problem   The history is provided by the patient. This is a chronic problem. The problem has not changed since onset.The symptoms are relieved by medications.   Neck Pain   The history is provided by the patient. This is a chronic problem. The problem occurs daily. Nothing relieves the symptoms.       Past Medical History, Past Surgical History, Family history, Social History, and Medications were all reviewed with the patient today and updated as necessary.       Current Outpatient Prescriptions   Medication Sig Dispense Refill   ??? zoledronic acid (RECLAST) 5 mg/100 mL soln 100 mL by IntraVENous route once for 1 dose. 100 mL 0   ??? CLINDAMYCIN HCL PO Take  by mouth two (2) times a day.     ??? INULIN (FIBER GUMMIES PO) Take 1 Tab by mouth two (2) times a day.     ??? PRIMIDONE PO Take 150 mg by mouth nightly.     ??? PRAMIPEXOLE DI-HCL (MIRAPEX PO) Take 3 mg by mouth nightly.     ??? propranolol LA (INDERAL LA) 120 mg SR capsule Take 120 mg by mouth nightly.     ??? GRALISE 300 mg Tb24 Take 1 Tab by mouth nightly.     ??? cyanocobalamin, vitamin B-12, 5,000 mcg subl 1 Tab by SubLINGual route daily.     ??? CALCIUM 600 WITH VITAMIN D3 600 mg(1,$RemoveBef'500mg'fQSyKrNziC$ ) -400 unit chew Take 1 Tab by mouth nightly.     ??? GRALISE 600 mg Tb24 Take 1 Tab by mouth nightly.     ??? oxyCODONE IR (ROXICODONE) 10 mg tab immediate release tablet Take 10 mg by mouth three (3) times daily.       No Known Allergies  Patient Active Problem List   Diagnosis Code   ??? 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 Bipolar disorder ([place]) F31.9   ??? Essential tremor G25.0   ??? Fibromyalgia M79.7    ??? Crohn's disease ([place]) K50.90   ??? HLD (hyperlipidemia) E78.5   ??? Vitamin B12 deficiency anemia D51.9   ??? Restless legs syndrome (RLS) G25.81   ??? Esophageal motility disorder K22.4   ??? Osteoporosis M81.0   ??? Insomnia G47.00   ??? Chronic neck pain M54.2, G89.29   ??? Vitamin D deficiency E55.9   ??? Chronic fatigue R53.82   ??? Thyroid disease E07.9   ??? Esophageal dysmotility K22.4   ??? Acute pain of left knee M25.562     Past Medical History   Diagnosis Date   ??? Arthritis      osteo, hips and neck   ??? Bipolar disorder ([place]) 07/17/2013   ??? Chronic neck pain      Fibromyalgia   ??? Crohn's disease ([place]) 07/17/2013   ??? Esophageal motility disorder    ??? Esophageal stricture    ??? Essential tremor 07/17/2013   ??? Fibromyalgia 07/17/2013   ??? HLD (hyperlipidemia) 07/17/2013   ??? Insomnia    ??? 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 Osteoporosis    ??? Pruritus of scalp    ???  Restless legs syndrome (RLS) 07/17/2013   ??? Unspecified vitamin D deficiency    ??? Vitamin B12 deficiency anemia 07/17/2013   ??? Webbing of the fingers or toes      Past Surgical History   Procedure Laterality Date   ??? Hx cataract removal Bilateral 2014   ??? Hx appendectomy     ??? Hx colonoscopy     ??? Hx open reduction internal fixation Left 2009     hip   ??? Hx colectomy  1992     with appendectomy after I&D of many perirectal abcesses   ??? Hx colectomy       colectomy x 4 total   ??? Hx cesarean section       x2   ??? Hx hysterectomy     ??? Hx orthopaedic Left 2008     ORIF knee   ??? Hx other surgical  1983     "Park Procedure" procedure to stop development of abcess     Family History   Problem Relation Age of Onset   ??? Elevated Lipids Mother    ??? Hypertension Mother    ??? Stroke Father      Social History   Substance Use Topics   ??? Smoking status: Never Smoker   ??? Smokeless tobacco: Not on file   ??? Alcohol use Yes      Comment: occasional         Review of Systems   Constitutional: Positive for unexpected weight change.   Cardiovascular: Negative for palpitations.    Musculoskeletal: Positive for myalgias and neck pain.   Psychiatric/Behavioral: Negative for sleep disturbance.         OBJECTIVE:  Visit Vitals   ??? BP 115/80   ??? Ht $Remo'5\' 4"'MViwR$  (1.626 m)   ??? Wt 150 lb (68 kg)   ??? BMI 25.75 kg/m2        Physical Exam   Constitutional: She appears well-developed and well-nourished.   Psychiatric: She has a normal mood and affect. Her behavior is normal.       Medical problems and test results were reviewed with the patient today.     Recent Results (from the past 672 hour(s))   CBC WITH AUTOMATED DIFF    Collection Time: 09/24/14 10:01 AM   Result Value Ref Range    WBC 8.6 3.4 - 10.8 x10E3/uL    RBC 4.19 3.77 - 5.28 x10E6/uL    HGB 11.9 11.1 - 15.9 g/dL    HCT 35.8 34.0 - 46.6 %    MCV 85 79 - 97 fL    MCH 28.4 26.6 - 33.0 pg    MCHC 33.2 31.5 - 35.7 g/dL    RDW 14.0 12.3 - 15.4 %    PLATELET 274 150 - 379 x10E3/uL    NEUTROPHILS 68 %    Lymphocytes 21 %    MONOCYTES 8 %    EOSINOPHILS 2 %    BASOPHILS 1 %    ABS. NEUTROPHILS 5.8 1.4 - 7.0 x10E3/uL    Abs Lymphocytes 1.8 0.7 - 3.1 x10E3/uL    ABS. MONOCYTES 0.7 0.1 - 0.9 x10E3/uL    ABS. EOSINOPHILS 0.2 0.0 - 0.4 x10E3/uL    ABS. BASOPHILS 0.1 0.0 - 0.2 x10E3/uL    IMMATURE GRANULOCYTES 0 %    ABS. IMM. GRANS. 0.0 0.0 - 0.1 K16W1/UX   METABOLIC PANEL, COMPREHENSIVE    Collection Time: 09/24/14 10:01 AM   Result Value Ref Range    Glucose  102 (H) 65 - 99 mg/dL    BUN 12 8 - 27 mg/dL    Creatinine 0.75 0.57 - 1.00 mg/dL    GFR est non-AA 84 >59 mL/min/1.73    GFR est AA 97 >59 mL/min/1.73    BUN/Creatinine ratio 16 11 - 26    Sodium 142 134 - 144 mmol/L    Potassium 3.6 3.5 - 5.2 mmol/L    Chloride 104 97 - 108 mmol/L    CO2 24 18 - 29 mmol/L    Calcium 9.0 8.7 - 10.3 mg/dL    Protein, total 6.1 6.0 - 8.5 g/dL    Albumin 3.8 3.6 - 4.8 g/dL    GLOBULIN, TOTAL 2.3 1.5 - 4.5 g/dL    A-G Ratio 1.7 1.1 - 2.5    Bilirubin, total 0.2 0.0 - 1.2 mg/dL    Alk. phosphatase 83 39 - 117 IU/L    AST 30 0 - 40 IU/L    ALT 36 (H) 0 - 32 IU/L          ASSESSMENT and PLAN    Darlene Cardenas was seen today for hypertension, cholesterol problem and neck pain.    Diagnoses and all orders for this visit:    Crohn's disease of small intestine without complication ([place])    Chronic neck pain  Comments:  had chronic pain, prior eval at [facility]    Thyroid disease    Bipolar disorder, in full remission, most recent episode mixed (HCC)    Other vitamin B12 deficiency anemia    Vitamin D deficiency    Osteoporosis  -     zoledronic acid (RECLAST) 5 mg/100 mL soln; 100 mL by IntraVENous route once for 1 dose.  -     REFERRAL TO INFUSION THERAPY  -     BMP; Future  Unable to take oral due t esophageal problems  Esophageal dysmotility  stable  Pure hypercholesterolemia  -     LIPID PANEL WITH LDL/HDL RATIO; Future          Follow-up Disposition:  Return in about 4 months (around 02/13/2015).

## 2014-10-22 ENCOUNTER — Encounter

## 2014-10-23 ENCOUNTER — Ambulatory Visit: Admit: 2014-10-23 | Discharge: 2014-10-23 | Payer: MEDICARE | Attending: Internal Medicine

## 2014-10-23 DIAGNOSIS — R350 Frequency of micturition: Secondary | ICD-10-CM

## 2014-10-23 LAB — AMB POC URINALYSIS DIP STICK MANUAL W/O MICRO
Bilirubin (UA POC): NEGATIVE
Blood (UA POC): NEGATIVE
Glucose (UA POC): NEGATIVE
Ketones (UA POC): NEGATIVE
Nitrites (UA POC): NEGATIVE
Protein (UA POC): NEGATIVE mg/dL
Specific gravity (UA POC): 1.025 (ref 1.001–1.035)
Urobilinogen (UA POC): 1 (ref 0.2–1)
pH (UA POC): 5 (ref 4.6–8.0)

## 2014-10-23 MED ORDER — CIPROFLOXACIN 250 MG TAB
250 mg | ORAL_TABLET | Freq: Two times a day (BID) | ORAL | 0 refills | Status: AC
Start: 2014-10-23 — End: ?

## 2014-10-23 MED ORDER — CIPROFLOXACIN 250 MG TAB
250 mg | ORAL_TABLET | Freq: Two times a day (BID) | ORAL | 0 refills | Status: DC
Start: 2014-10-23 — End: 2014-10-23

## 2014-10-23 NOTE — Progress Notes (Signed)
PROGRESS NOTE    SUBJECTIVE:   Darlene Cardenas is a 64 y.o. female seen for a follow up visit regarding Urinary Frequency (x 2 days); Incontinence; and Incomplete Bladder Emptying    Urinary Frequency    The history is provided by the patient. This is a new problem. The current episode started yesterday. The problem occurs every urination. The quality of the pain is described as burning. The pain is mild. There has been no fever. Associated symptoms include frequency. Pertinent negatives include no chills. Past medical history comments: Crohns.   Incontinence   The history is provided by the patient.       Past Medical History, Past Surgical History, Family history, Social History, and Medications were all reviewed with the patient today and updated as necessary.       Current Outpatient Prescriptions   Medication Sig Dispense Refill   ??? ciprofloxacin HCl (CIPRO) 250 mg tablet Take 1 Tab by mouth two (2) times a day. 6 Tab 0   ??? INULIN (FIBER GUMMIES PO) Take 1 Tab by mouth two (2) times a day.     ??? PRIMIDONE PO Take 150 mg by mouth nightly.     ??? PRAMIPEXOLE DI-HCL (MIRAPEX PO) Take 3 mg by mouth nightly.     ??? propranolol LA (INDERAL LA) 120 mg SR capsule Take 120 mg by mouth nightly.     ??? GRALISE 300 mg Tb24 Take 1 Tab by mouth nightly.     ??? cyanocobalamin, vitamin B-12, 5,000 mcg subl 1 Tab by SubLINGual route daily.     ??? CALCIUM 600 WITH VITAMIN D3 600 mg(1,500mg ) -400 unit chew Take 1 Tab by mouth nightly.     ??? GRALISE 600 mg Tb24 Take 1 Tab by mouth nightly.     ??? oxyCODONE IR (ROXICODONE) 10 mg tab immediate release tablet Take 10 mg by mouth three (3) times daily.     ??? CLINDAMYCIN HCL PO Take  by mouth two (2) times a day.       No Known Allergies  Patient Active Problem List   Diagnosis Code   ??? 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 Bipolar disorder (HCC) F31.9   ??? Essential tremor G25.0   ??? Fibromyalgia M79.7   ??? Crohn's disease ([facility]) K50.90   ??? HLD (hyperlipidemia) E78.5    ??? Vitamin B12 deficiency anemia D51.9   ??? Restless legs syndrome (RLS) G25.81   ??? Esophageal motility disorder K22.4   ??? Osteoporosis M81.0   ??? Insomnia G47.00   ??? Chronic neck pain M54.2, G89.29   ??? Vitamin D deficiency E55.9   ??? Chronic fatigue R53.82   ??? Thyroid disease E07.9   ??? Esophageal dysmotility K22.4   ??? Acute pain of left knee M25.562   ??? Urine frequency R35.0   ??? Incontinence R32   ??? Folliculitis L73.9   ??? Crohn's colitis (HCC) K50.10     Past Medical History   Diagnosis Date   ??? Arthritis      osteo, hips and neck   ??? Bipolar disorder (HCC) 07/17/2013   ??? Chronic neck pain      Fibromyalgia   ??? Crohn's disease (HCC) 07/17/2013   ??? Esophageal motility disorder    ??? Esophageal stricture    ??? Essential tremor 07/17/2013   ??? Fibromyalgia 07/17/2013   ??? HLD (hyperlipidemia) 07/17/2013   ??? Insomnia    ??? 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 Osteoporosis    ??? Pruritus of  scalp    ??? Restless legs syndrome (RLS) 07/17/2013   ??? Unspecified vitamin D deficiency    ??? Vitamin B12 deficiency anemia 07/17/2013   ??? Webbing of the fingers or toes      Past Surgical History   Procedure Laterality Date   ??? Hx cataract removal Bilateral 2014   ??? Hx appendectomy     ??? Hx colonoscopy     ??? Hx open reduction internal fixation Left 2009     hip   ??? Hx colectomy  1992     with appendectomy after I&D of many perirectal abcesses   ??? Hx colectomy       colectomy x 4 total   ??? Hx cesarean section       x2   ??? Hx hysterectomy     ??? Hx orthopaedic Left 2008     ORIF knee   ??? Hx other surgical  1983     "Park Procedure" procedure to stop development of abcess     Family History   Problem Relation Age of Onset   ??? Elevated Lipids Mother    ??? Hypertension Mother    ??? Stroke Father      Social History   Substance Use Topics   ??? Smoking status: Never Smoker   ??? Smokeless tobacco: Not on file   ??? Alcohol use Yes      Comment: occasional         Review of Systems   Constitutional: Negative for chills and fever.   Gastrointestinal: Positive for diarrhea.    Genitourinary: Positive for frequency.         OBJECTIVE:  Visit Vitals   ??? BP 122/80   ??? Ht 5\' 4"  (1.626 m)   ??? Wt 146 lb (66.2 kg)   ??? BMI 25.06 kg/m2        Physical Exam   Constitutional: She appears well-developed and well-nourished.   Psychiatric: She has a normal mood and affect. Her behavior is normal.       Medical problems and test results were reviewed with the patient today.     Recent Results (from the past 672 hour(s))   AMB POC URINALYSIS DIP STICK MANUAL W/O MICRO    Collection Time: 10/23/14 11:01 AM   Result Value Ref Range    Color (UA POC) Colorless     Clarity (UA POC) Clear     Glucose (UA POC) Negative Negative    Bilirubin (UA POC) Negative Negative    Ketones (UA POC) Negative Negative    Specific gravity (UA POC) 1.025 1.001 - 1.035    Blood (UA POC) Negative Negative    pH (UA POC) 5.0 4.6 - 8.0    Protein (UA POC) Negative Negative mg/dL    Urobilinogen (UA POC) 1 mg/dL 0.2 - 1    Nitrites (UA POC) Negative Negative    Leukocyte esterase (UA POC) Trace Negative         ASSESSMENT and PLAN    Darlene Cardenas was seen today for urinary frequency, incontinence and incomplete bladder emptying.    Diagnoses and all orders for this visit:    Urine frequency  -     AMB POC URINALYSIS DIP STICK MANUAL W/O MICRO  -     CULTURE, URINE ([id number])  -     ciprofloxacin HCl (CIPRO) 250 mg tablet; Take 1 Tab by mouth two (2) times a day.  Sx of foul smell and inontinence more c/w infection than  dip urine  Incontinence  -     AMB POC URINALYSIS DIP STICK MANUAL W/O MICRO    Folliculitis    On cleocin for this and better, will take probiotics for a few days  Call back Mon if not better    Follow-up Disposition: Not on File

## 2014-10-27 LAB — CULTURE, URINE

## 2014-11-20 ENCOUNTER — Encounter

## 2014-12-02 ENCOUNTER — Inpatient Hospital Stay: Admit: 2014-12-02 | Payer: MEDICARE

## 2014-12-02 ENCOUNTER — Ambulatory Visit: Payer: MEDICARE

## 2014-12-02 DIAGNOSIS — M810 Age-related osteoporosis without current pathological fracture: Secondary | ICD-10-CM

## 2014-12-02 LAB — CALCIUM: Calcium: 8.9 MG/DL (ref 8.3–10.4)

## 2014-12-02 LAB — CREATININE: Creatinine: 0.83 MG/DL (ref 0.6–1.0)

## 2014-12-02 MED ORDER — SODIUM CHLORIDE 0.9 % IJ SYRG
Freq: Three times a day (TID) | INTRAMUSCULAR | Status: DC
Start: 2014-12-02 — End: 2014-12-06
  Administered 2014-12-02: 17:00:00 via INTRAVENOUS

## 2014-12-02 MED ORDER — ZOLEDRONIC ACID 5 MG/100 ML IN MANNITOL & WATER IV
5 mg/100 mL | Freq: Once | INTRAVENOUS | Status: AC
Start: 2014-12-02 — End: 2014-12-02
  Administered 2014-12-02: 17:00:00 via INTRAVENOUS

## 2014-12-02 MED FILL — ZOLEDRONIC ACID 5 MG/100 ML IN MANNITOL & WATER IV: 5 mg/100 mL | INTRAVENOUS | Qty: 100

## 2014-12-02 NOTE — Progress Notes (Signed)
Pt. Discharged ambulatory accompanied by self.  Tolerated infusion well.  No distress noted.  To make next infusion appointment when needed.

## 2015-01-28 MED ORDER — PRAMIPEXOLE 1 MG TAB
1 mg | ORAL_TABLET | Freq: Every evening | ORAL | 1 refills | Status: AC
Start: 2015-01-28 — End: ?

## 2015-01-28 MED ORDER — GABAPENTIN ER 600 MG TABLET,EXTENDED RELEASE 24 HR
600 mg | ORAL_TABLET | Freq: Every evening | ORAL | 1 refills | Status: AC
Start: 2015-01-28 — End: ?

## 2015-01-28 MED ORDER — PRIMIDONE 50 MG TAB
50 mg | ORAL_TABLET | Freq: Three times a day (TID) | ORAL | 1 refills | Status: AC
Start: 2015-01-28 — End: ?

## 2015-01-28 MED ORDER — PROPRANOLOL 120 MG 24 HR SUSTAINED ACTION CAP
120 mg | ORAL_CAPSULE | Freq: Every evening | ORAL | 1 refills | Status: AC
Start: 2015-01-28 — End: ?

## 2015-01-28 MED ORDER — GRALISE 300 MG TABLET,EXTENDED RELEASE
300 mg | ORAL_TABLET | Freq: Every evening | ORAL | 1 refills | Status: AC
Start: 2015-01-28 — End: ?

## 2015-01-28 NOTE — Telephone Encounter (Signed)
Pt states her insurance change to [employer] and she will [facility] and also call in refills to [facility] on [address]. Pt needs pramepexole,primidone,propanolol and gralise.

## 2015-01-28 NOTE — Telephone Encounter (Signed)
rx sent [facility] the [facility] [place]

## 2015-02-18 ENCOUNTER — Encounter

## 2015-02-25 ENCOUNTER — Encounter: Attending: Internal Medicine

## 2016-01-02 ENCOUNTER — Emergency Department (HOSPITAL_COMMUNITY): Payer: Medicare HMO

## 2016-01-02 ENCOUNTER — Encounter (HOSPITAL_COMMUNITY): Payer: Self-pay | Admitting: Emergency Medicine

## 2016-01-02 ENCOUNTER — Inpatient Hospital Stay (HOSPITAL_COMMUNITY)
Admission: EM | Admit: 2016-01-02 | Discharge: 2016-01-07 | DRG: 918 | Disposition: A | Discharge status: Other Acute Inpatient Hospital | Payer: Medicare HMO | Attending: Internal Medicine | Admitting: Internal Medicine

## 2016-01-02 DIAGNOSIS — G9340 Encephalopathy, unspecified: Secondary | ICD-10-CM

## 2016-01-02 DIAGNOSIS — E162 Hypoglycemia, unspecified: Secondary | ICD-10-CM | POA: Diagnosis not present

## 2016-01-02 DIAGNOSIS — R451 Restlessness and agitation: Secondary | ICD-10-CM | POA: Diagnosis present

## 2016-01-02 DIAGNOSIS — R410 Disorientation, unspecified: Secondary | ICD-10-CM | POA: Diagnosis present

## 2016-01-02 DIAGNOSIS — K5090 Crohn's disease, unspecified, without complications: Secondary | ICD-10-CM | POA: Diagnosis present

## 2016-01-02 DIAGNOSIS — T50901A Poisoning by unspecified drugs, medicaments and biological substances, accidental (unintentional), initial encounter: Secondary | ICD-10-CM

## 2016-01-02 DIAGNOSIS — M797 Fibromyalgia: Secondary | ICD-10-CM | POA: Diagnosis present

## 2016-01-02 DIAGNOSIS — T447X2A Poisoning by beta-adrenoreceptor antagonists, intentional self-harm, initial encounter: Principal | ICD-10-CM | POA: Diagnosis present

## 2016-01-02 DIAGNOSIS — R001 Bradycardia, unspecified: Secondary | ICD-10-CM | POA: Diagnosis present

## 2016-01-02 DIAGNOSIS — E876 Hypokalemia: Secondary | ICD-10-CM | POA: Diagnosis present

## 2016-01-02 DIAGNOSIS — Q740 Other congenital malformations of upper limb(s), including shoulder girdle: Secondary | ICD-10-CM

## 2016-01-02 DIAGNOSIS — Q742 Other congenital malformations of lower limb(s), including pelvic girdle: Secondary | ICD-10-CM

## 2016-01-02 DIAGNOSIS — F314 Bipolar disorder, current episode depressed, severe, without psychotic features: Secondary | ICD-10-CM

## 2016-01-02 DIAGNOSIS — F319 Bipolar disorder, unspecified: Secondary | ICD-10-CM | POA: Diagnosis present

## 2016-01-02 HISTORY — DX: Herpesviral infection, unspecified: B00.9

## 2016-01-02 HISTORY — DX: Depression, unspecified: F32.A

## 2016-01-02 HISTORY — DX: Fibromyalgia: M79.7

## 2016-01-02 HISTORY — DX: Major depressive disorder, single episode, unspecified: F32.9

## 2016-01-02 HISTORY — DX: Anxiety disorder, unspecified: F41.9

## 2016-01-02 HISTORY — DX: Crohn's disease, unspecified, without complications: K50.90

## 2016-01-02 LAB — CBC WITH DIFFERENTIAL/PLATELET
BASOS PCT: 1 %
Basophils Absolute: 0.1 10*3/uL (ref 0.0–0.1)
EOS ABS: 0.2 10*3/uL (ref 0.0–0.7)
Eosinophils Relative: 2 %
HEMATOCRIT: 46.8 % — AB (ref 36.0–46.0)
HEMOGLOBIN: 15.9 g/dL — AB (ref 12.0–15.0)
LYMPHS ABS: 3 10*3/uL (ref 0.7–4.0)
Lymphocytes Relative: 27 %
MCH: 28.8 pg (ref 26.0–34.0)
MCHC: 34 g/dL (ref 30.0–36.0)
MCV: 84.8 fL (ref 78.0–100.0)
MONOS PCT: 6 %
Monocytes Absolute: 0.7 10*3/uL (ref 0.1–1.0)
NEUTROS ABS: 7.2 10*3/uL (ref 1.7–7.7)
NEUTROS PCT: 64 %
Platelets: 346 10*3/uL (ref 150–400)
RBC: 5.52 MIL/uL — AB (ref 3.87–5.11)
RDW: 13.1 % (ref 11.5–15.5)
WBC: 11.1 10*3/uL — AB (ref 4.0–10.5)

## 2016-01-02 LAB — BASIC METABOLIC PANEL
ANION GAP: 3 — AB (ref 5–15)
BUN: 10 mg/dL (ref 6–20)
CALCIUM: 7.8 mg/dL — AB (ref 8.9–10.3)
CHLORIDE: 117 mmol/L — AB (ref 101–111)
CO2: 20 mmol/L — AB (ref 22–32)
Creatinine, Ser: 1.1 mg/dL — ABNORMAL HIGH (ref 0.44–1.00)
GFR calc non Af Amer: 51 mL/min — ABNORMAL LOW (ref >60)
GFR, EST AFRICAN AMERICAN: 59 mL/min — AB (ref >60)
Glucose, Bld: 236 mg/dL — ABNORMAL HIGH (ref 65–99)
Potassium: 2.5 mmol/L — CL (ref 3.5–5.1)
SODIUM: 140 mmol/L (ref 135–145)

## 2016-01-02 LAB — URINALYSIS, ROUTINE W REFLEX MICROSCOPIC
Bilirubin Urine: NEGATIVE
Glucose, UA: >1000 mg/dL — AB
Hgb urine dipstick: NEGATIVE
Ketones, ur: NEGATIVE mg/dL
NITRITE: NEGATIVE
PH: 6 (ref 5.0–8.0)
Protein, ur: NEGATIVE mg/dL
SPECIFIC GRAVITY, URINE: 1.017 (ref 1.005–1.030)

## 2016-01-02 LAB — URINE MICROSCOPIC-ADD ON

## 2016-01-02 LAB — RAPID URINE DRUG SCREEN, HOSP PERFORMED
Amphetamines: NOT DETECTED
Barbiturates: NOT DETECTED
Benzodiazepines: POSITIVE — AB
Cocaine: NOT DETECTED
OPIATES: NOT DETECTED
TETRAHYDROCANNABINOL: NOT DETECTED

## 2016-01-02 LAB — CBG MONITORING, ED
GLUCOSE-CAPILLARY: 324 mg/dL — AB (ref 65–99)
GLUCOSE-CAPILLARY: 212 mg/dL — AB (ref 65–99)
GLUCOSE-CAPILLARY: 219 mg/dL — AB (ref 65–99)
GLUCOSE-CAPILLARY: 266 mg/dL — AB (ref 65–99)
Glucose-Capillary: 98 mg/dL (ref 65–99)
Glucose-Capillary: 289 mg/dL — ABNORMAL HIGH (ref 65–99)
Glucose-Capillary: 311 mg/dL — ABNORMAL HIGH (ref 65–99)
Glucose-Capillary: 271 mg/dL — ABNORMAL HIGH (ref 65–99)
Glucose-Capillary: 191 mg/dL — ABNORMAL HIGH (ref 65–99)
Glucose-Capillary: 192 mg/dL — ABNORMAL HIGH (ref 65–99)
Glucose-Capillary: 200 mg/dL — ABNORMAL HIGH (ref 65–99)

## 2016-01-02 LAB — COMPREHENSIVE METABOLIC PANEL
ALBUMIN: 4.6 g/dL (ref 3.5–5.0)
ALK PHOS: 84 U/L (ref 38–126)
ALT: 28 U/L (ref 14–54)
AST: 28 U/L (ref 15–41)
Anion gap: 9 (ref 5–15)
BILIRUBIN TOTAL: 1.5 mg/dL — AB (ref 0.3–1.2)
BUN: 10 mg/dL (ref 6–20)
CALCIUM: 9.1 mg/dL (ref 8.9–10.3)
CO2: 23 mmol/L (ref 22–32)
CREATININE: 1.09 mg/dL — AB (ref 0.44–1.00)
Chloride: 111 mmol/L (ref 101–111)
GFR calc Af Amer: 60 mL/min — ABNORMAL LOW (ref >60)
GFR calc non Af Amer: 52 mL/min — ABNORMAL LOW (ref >60)
GLUCOSE: 102 mg/dL — AB (ref 65–99)
Potassium: 4.3 mmol/L (ref 3.5–5.1)
SODIUM: 143 mmol/L (ref 135–145)
TOTAL PROTEIN: 6.8 g/dL (ref 6.5–8.1)

## 2016-01-02 LAB — I-STAT TROPONIN, ED: Troponin i, poc: 0.01 ng/mL (ref 0.00–0.08)

## 2016-01-02 LAB — GLUCOSE, CAPILLARY
GLUCOSE-CAPILLARY: 169 mg/dL — AB (ref 65–99)
GLUCOSE-CAPILLARY: 195 mg/dL — AB (ref 65–99)
GLUCOSE-CAPILLARY: 127 mg/dL — AB (ref 65–99)
Glucose-Capillary: 140 mg/dL — ABNORMAL HIGH (ref 65–99)
Glucose-Capillary: 144 mg/dL — ABNORMAL HIGH (ref 65–99)
Glucose-Capillary: 165 mg/dL — ABNORMAL HIGH (ref 65–99)

## 2016-01-02 LAB — AMMONIA: Ammonia: 32 umol/L (ref 9–35)

## 2016-01-02 LAB — I-STAT CG4 LACTIC ACID, ED: Lactic Acid, Venous: 2.04 mmol/L (ref 0.5–1.9)

## 2016-01-02 LAB — ETHANOL

## 2016-01-02 LAB — SALICYLATE LEVEL

## 2016-01-02 LAB — I-STAT BETA HCG BLOOD, ED (MC, WL, AP ONLY): HCG, QUANTITATIVE: 6.6 m[IU]/mL — AB (ref <5)

## 2016-01-02 LAB — ACETAMINOPHEN LEVEL

## 2016-01-02 LAB — MRSA PCR SCREENING: MRSA BY PCR: NEGATIVE

## 2016-01-02 MED ORDER — ONDANSETRON HCL 4 MG/2ML IJ SOLN: 4.0000 mg | Freq: Once | INTRAMUSCULAR | Status: DC

## 2016-01-02 MED ORDER — DEXTROSE 10 % IV SOLN
INTRAVENOUS | Status: DC
  Administered 2016-01-02 – 2016-01-04 (×11): via INTRAVENOUS
  Filled 2016-01-02 (×4): qty 1000

## 2016-01-02 MED ORDER — SODIUM CHLORIDE 0.9 % IV BOLUS (SEPSIS)
1000.0000 mL | Freq: Once | INTRAVENOUS | Status: AC
  Administered 2016-01-02: 1000 mL via INTRAVENOUS

## 2016-01-02 MED ORDER — SODIUM CHLORIDE 0.9 % IV SOLN: Freq: Once | INTRAVENOUS | Status: DC

## 2016-01-02 MED ORDER — SODIUM CHLORIDE 0.9 % IV SOLN
61.0000 [IU]/h | INTRAVENOUS | Status: DC
  Administered 2016-01-02 – 2016-01-03 (×3): 61 [IU]/h via INTRAVENOUS
  Filled 2016-01-02 (×12): qty 2.5

## 2016-01-02 MED ORDER — SODIUM CHLORIDE 0.9 % IV BOLUS (SEPSIS): 1000.0000 mL | Freq: Once | INTRAVENOUS | Status: DC

## 2016-01-02 MED ORDER — DEXTROSE 50 % IV SOLN
30.0000 g | Freq: Once | INTRAVENOUS | Status: AC
  Administered 2016-01-02: 30 g via INTRAVENOUS
  Filled 2016-01-02: qty 50

## 2016-01-02 MED ORDER — POTASSIUM CHLORIDE 2 MEQ/ML IV SOLN
30.0000 meq | INTRAVENOUS | Status: AC
  Administered 2016-01-02 – 2016-01-03 (×2): 30 meq via INTRAVENOUS
  Filled 2016-01-02 (×2): qty 15

## 2016-01-02 MED ORDER — GLUCAGON HCL RDNA (DIAGNOSTIC) 1 MG IJ SOLR: 3.0000 mg | Freq: Once | INTRAMUSCULAR | Status: DC

## 2016-01-02 MED ORDER — SODIUM CHLORIDE 0.9 % IV SOLN
INTRAVENOUS | Status: DC
  Administered 2016-01-02 (×2): 61 [IU]/h via INTRAVENOUS
  Filled 2016-01-02: qty 2.5

## 2016-01-02 MED ORDER — ONDANSETRON HCL 4 MG/2ML IJ SOLN
4.0000 mg | Freq: Once | INTRAMUSCULAR | Status: AC
  Administered 2016-01-02: 4 mg via INTRAVENOUS
  Filled 2016-01-02: qty 2

## 2016-01-02 MED ORDER — DEXTROSE 5 % IV SOLN
0.5000 ug/min | INTRAVENOUS | Status: DC
  Administered 2016-01-02: 0.5 ug/min via INTRAVENOUS
  Administered 2016-01-03: 5.5 ug/min via INTRAVENOUS
  Filled 2016-01-02 (×3): qty 4

## 2016-01-02 MED ORDER — CHARCOAL ACTIVATED PO LIQD
50.0000 g | Freq: Once | ORAL | Status: AC
  Administered 2016-01-02: 50 g via ORAL
  Filled 2016-01-02: qty 240

## 2016-01-02 NOTE — Progress Notes (Signed)
CM spoke with pt who confirms uninsured [place] resident with no pcp.  CM discussed and provided written information to assist pt with determining choice for uninsured accepting pcps, discussed the importance of pcp vs EDP services for f/u care, www.needymeds.org, www.goodrx.com, discounted pharmacies and other [place] resources such as [facility] , [facility], affordable care act, financial assistance, uninsured dental services, [place] med assist, DSS and  health department  Reviewed resources for [place] uninsured accepting pcps like [facility], [facility] [address], [facility], [facility], local urgent care centers, [facility], [facility], general [facility] clinics, [facility], [facility] plus others, medication resources, [facility] out patient [facility]ies and housing Pt voiced understanding and appreciation of resources provided   Provided [facility] contact information Pt agreed to a referral Cm completed referral Pt to be contact by [facility] clinical liaison  

## 2016-01-02 NOTE — Progress Notes (Signed)
CRITICAL VALUE ALERT  Critical value received:  k 2.5  Date of notification:  01/02/16  Time of notification:  2200  Critical value read back:yes  Nurse who received alert:  elink nurse  MD notified (1st page):  Dr. Jamison Neighbor  Time of first page:  2201

## 2016-01-02 NOTE — ED Triage Notes (Signed)
Per [facility] pt was found in camper where pt is staying with a friend. States she found pt not responding with an empty pill bottle beside her. Pts friend states pt hx of bipolar and will have these episodes where she will sleep for days. Pt did not come to dinner or breakfast so checked on her. Unsure of when pt took pills or how many taken. Pts bottle brought is propranolol 120mg  tablets. Prescription filled October 4th, possibly 47 missing pills. Pt states she took them a couple hours ago. Pt tearful at moment.   Pt has hx pf suicide attempt according to friend.

## 2016-01-02 NOTE — H&P (Signed)
PULMONARY / CRITICAL CARE MEDICINE   Name: Dana Wolfe MRN: [id number] DOB: 02/07/1951    ADMISSION DATE:  01/02/2016 CONSULTATION DATE:    REFERRING MD:  EDP  CHIEF COMPLAINT:  Intentional overdose (beta blocker)  HISTORY OF PRESENT ILLNESS:   Ms. Dana Wolfe is a 20F with history of Crohn's disease, bipolar disorder, fibromyalgia, and congential deformities (digits of hands/feet), who presents via EMS after being found down by a friend with a bottle of propranolol 120mg  beside her. Patient endorsed suicidal ideation and that this was an intentional overdose. She was initially monitored in the ED, but then became bradycardic and required high dose insulin with D10. She has had some intermittent hypotension requiring 2L fluid bolus in the ED.   She endorses active suicidal ideation. Denies pain. No shortness of breath / chest pain / cough / nausea / vomiting / urinary complaints.   PAST MEDICAL HISTORY :  She  has a past medical history of Anxiety; Crohn disease (HCC); Depression; Fibromyalgia; and Herpes.  PAST SURGICAL HISTORY: She  has a past surgical history that includes Colon resection and Cesarean section.  No Known Allergies  No current facility-administered medications on file prior to encounter.    No current outpatient prescriptions on file prior to encounter.    FAMILY HISTORY:  Her has no family status information on file.   SOCIAL HISTORY: She  reports that she has never smoked. She has never used smokeless tobacco. She reports that she does not use drugs. No EtOH.   REVIEW OF SYSTEMS:   Limited 2/2 patient cooperation. General: no weight change, no fever, no chills Cardiovascular: no chest pain, palpitations, orthopnea, or PND Respiratory: no cough, sputum production, shortness of breath Gastrointestinal: no nausea, vomiting, diarrhea, or constipation Genitourinary: no pain with urination, no foul odor, no frequency, no urgency Musculoskeletal: + L arm pain  from IV Skin: no rashes, sores, or ulcers Psychiatric: + suicidal ideation   SUBJECTIVE:   VITAL SIGNS: BP (!) 85/51   Pulse (!) 52   Temp 97.8 F (36.6 C) (Oral)   Resp 14   Wt 61.2 kg (135 lb) Comment: estimate  SpO2 97%   HEMODYNAMICS:    VENTILATOR SETTINGS:    INTAKE / OUTPUT: No intake/output data recorded.  PHYSICAL EXAMINATION:  General Well nourished, well developed, no apparent distress  HEENT No gross abnormalities.   Pulmonary Clear to auscultation bilaterally with no wheezes, rales or ronchi. Good effort, symmetrical expansion.   Cardiovascular Normal rate, regular rhythm. S1, s2. No m/r/g. Distal pulses palpable.  Abdomen Soft, non-tender, non-distended, positive bowel sounds, no palpable organomegaly or masses. Normoresonant to percussion.  Musculoskeletal Congenital abnormalities of fingers and toes; otherwise grossly normal.  Lymphatics No cervical, supraclavicular or axillary adenopathy.   Neurologic Grossly intact. No focal deficits.   Skin/Integuement No rash, no cyanosis, no clubbing. No edema     LABS:  BMET  Recent Labs Lab 01/02/16 1536 01/02/16 2030  NA 143 140  K 4.3 2.5*  CL 111 117*  CO2 23 20*  BUN 10 10  CREATININE 1.09* 1.10*  GLUCOSE 102* 236*    Electrolytes  Recent Labs Lab 01/02/16 1536 01/02/16 2030  CALCIUM 9.1 7.8*    CBC  Recent Labs Lab 01/02/16 1536  WBC 11.1*  HGB 15.9*  HCT 46.8*  PLT 346    Coag's No results for input(s): APTT, INR in the last 168 hours.  Sepsis Markers  Recent Labs Lab 01/02/16 1554  LATICACIDVEN 2.04*  ABG No results for input(s): PHART, PCO2ART, PO2ART in the last 168 hours.  Liver Enzymes  Recent Labs Lab 01/02/16 1536  AST 28  ALT 28  ALKPHOS 84  BILITOT 1.5*  ALBUMIN 4.6    Cardiac Enzymes No results for input(s): TROPONINI, PROBNP in the last 168 hours.  Glucose  Recent Labs Lab 01/02/16 2109 01/02/16 2123 01/02/16 2146 01/02/16 2223  01/02/16 2243 01/02/16 2300  GLUCAP 200* 191* 195* 169* 165* 144*    Imaging Dg Chest 1 View  Result Date: 01/02/2016 CLINICAL DATA:  Unresponsive.  Possible overdose. EXAM: CHEST 1 VIEW COMPARISON:  05/01/2009 FINDINGS: Artifact overlies chest. Heart size is normal. Mediastinal shadows are normal. The lungs are clear. No effusions. Chronic spinal curvature. IMPRESSION: No active disease. Electronically Signed   By: Paulina FusiMark  Shogry M.D.   On: 01/02/2016 16:35   Ct Head Wo Contrast  Result Date: 01/02/2016 CLINICAL DATA:  Altered mental status EXAM: CT HEAD WITHOUT CONTRAST TECHNIQUE: Contiguous axial images were obtained from the base of the skull through the vertex without intravenous contrast. COMPARISON:  04/27/2009 FINDINGS: Brain: No evidence of acute or remote infarction, hemorrhage, hydrocephalus, extra-axial collection or mass lesion/mass effect. Vascular: No hyperdense vessel or unexpected calcification. Skull: No acute or aggressive finding. Sinuses/Orbits: Mild mucosal edema in the ethmoid sinuses. IMPRESSION: No acute intracranial finding or change from 2011. Electronically Signed   By: Marnee SpringJonathon  Watts M.D.   On: 01/02/2016 16:40     STUDIES:  As above  CULTURES: None  ANTIBIOTICS: None  SIGNIFICANT EVENTS:   LINES/TUBES: PIV  DISCUSSION: Ms. Dana AugustaFalcetti is a [age]F with history of bipolar disorder, Crohn's, fibromyalgia who presents after intentional beta blocker overdose with suicidal intentions. In the ED she became bradycardic with some hemodynamic instability and is admitted to ICU for monitoring of high dose insulin therapy.   ASSESSMENT / PLAN:  CARDIOVASCULAR A:  Bradycardia 2/2 beta blocker toxicity Hypotension P:  Continue high dose insulin therapy Epi gtt as needed for MAP > 65  PSYCHIATRIC A:  Active suicidal ideation/intent P: Suicide precautions Psych consult in AM  PULMONARY A: No acute issues P:   Monitor  RENAL A:   Hypokalemia 2/2  high dose insulin P:   Replete K Trend BMP  GASTROINTESTINAL A:   No acute issues Hx Crohns P:   Advance diet as tolerated  HEMATOLOGIC A:   No acute issues; CBC appears hemoconcentrated P:  Fluid resuscitate Trend CBC No infectious symptoms; defer Abx  INFECTIOUS A:   No acute infection P:     ENDOCRINE A:   Glycemic control   P:   Maintain euglycemia while on insulin gtt w/ D10  NEUROLOGIC A:   No acute issues P:   RASS goal: 0   FAMILY  - Updates: No family available. Patient updated. Patient with limited decision-making capacity given active suicidal ideation; will remain FULL CODE pending psych eval.  - Inter-disciplinary family meet or Palliative Care meeting due by:  day 7  The patient is critically ill with multiple organ system failure and requires high complexity decision making for assessment and support, frequent evaluation and titration of therapies, advanced monitoring, review of radiographic studies and interpretation of complex data.   Critical Care Time devoted to patient care services, exclusive of separately billable procedures, described in this note is 37 minutes.   Nita SickleSarah Ellen E. Stephens, MD Pulmonary and Critical Care Medicine [facility] Pager: [phone]  01/02/2016, 11:14 PM

## 2016-01-02 NOTE — Progress Notes (Signed)
eLink Physician-Brief Progress Note Patient Name: Henny Sabatini DOB: 12-22-1950 MRN: [id number]   Date of Service  01/02/2016  HPI/Events of Note  Hypokalemia 2.5. Currently on insulin drip for beta blocker overdose.  eICU Interventions  KCl IV x2 doses     Intervention Category Intermediate Interventions: Electrolyte abnormality - evaluation and management  Lawanda Cousins 01/02/2016, 10:12 PM

## 2016-01-02 NOTE — ED Notes (Addendum)
Pt son Loraine Leriche [phone] or [phone]

## 2016-01-02 NOTE — ED Notes (Signed)
Patient transported to CT 

## 2016-01-02 NOTE — ED Provider Notes (Signed)
[facility]-EMERGENCY DEPT Provider Note   CSN: [id number] Arrival date & time: 01/02/16  1513     History   Chief Complaint Chief Complaint  Patient presents with  . Drug Overdose    HPI Dana Wolfe is a 65 y.o. female.  HPI   Patient is a 65 year old female presenting with overdose. Patient did not show up for dinner with her friend. Her friend went to her house today and found her sleeping and she could not awake her. When EMS arrived they had no trouble arousing the patient however they did find an empty bottle of propanolol. According to calculations there were be about 42 pills left. Patient does not want to talk this provider.  She told the tech that she took them before lunch.  Level V caveat psychiatric  Past Medical History:  Diagnosis Date  . Anxiety   . Crohn disease (HCC)   . Depression   . Fibromyalgia   . Herpes     Patient Active Problem List   Diagnosis Date Noted  . Suicide attempt by beta blocker overdose (HCC) 01/02/2016    Past Surgical History:  Procedure Laterality Date  . CESAREAN SECTION     x 2  . COLON RESECTION     for chrons per pt    OB History    No data available       Home Medications    Prior to Admission medications   Not on File    Family History History reviewed. No pertinent family history.  Social History Social History  Substance Use Topics  . Smoking status: Never Smoker  . Smokeless tobacco: Never Used  . Alcohol use Not on file     Comment: quit in 1998 per pt - states she was not a heavy drinker     Allergies   Patient has no known allergies.   Review of Systems Review of Systems  Unable to perform ROS: Psychiatric disorder     Physical Exam Updated Vital Signs BP (!) 88/46 (BP Location: Left Arm)   Pulse (!) 53   Temp 97.8 F (36.6 C) (Oral)   Resp 16   Wt 135 lb (61.2 kg) Comment: estimate  SpO2 97%   Physical Exam  Constitutional: She appears well-developed and well-nourished.    HENT:  Head: Normocephalic and atraumatic.  Eyes: Right eye exhibits no discharge.  Cardiovascular: Normal rate, regular rhythm and normal heart sounds.   No murmur heard. Pulmonary/Chest: Effort normal and breath sounds normal. She has no wheezes. She has no rales.  Abdominal: Soft. She exhibits no distension. There is no tenderness.  Skin: Skin is warm and dry. She is not diaphoretic.  Psychiatric:  Patient balled up crying and not talking.  Nursing note and vitals reviewed.    ED Treatments / Results  Labs (all labs ordered are listed, but only abnormal results are displayed) Labs Reviewed  CBC WITH DIFFERENTIAL/PLATELET - Abnormal; Notable for the following:       Result Value   WBC 11.1 (*)    RBC 5.52 (*)    Hemoglobin 15.9 (*)    HCT 46.8 (*)    All other components within normal limits  COMPREHENSIVE METABOLIC PANEL - Abnormal; Notable for the following:    Glucose, Bld 102 (*)    Creatinine, Ser 1.09 (*)    Total Bilirubin 1.5 (*)    GFR calc non Af Amer 52 (*)    GFR calc Af Amer 60 (*)  All other components within normal limits  URINALYSIS, ROUTINE W REFLEX MICROSCOPIC (NOT AT [facility]) - Abnormal; Notable for the following:    APPearance CLOUDY (*)    Glucose, UA >1000 (*)    Leukocytes, UA SMALL (*)    All other components within normal limits  ACETAMINOPHEN LEVEL - Abnormal; Notable for the following:    Acetaminophen (Tylenol), Serum <10 (*)    All other components within normal limits  RAPID URINE DRUG SCREEN, HOSP PERFORMED - Abnormal; Notable for the following:    Benzodiazepines POSITIVE (*)    All other components within normal limits  URINE MICROSCOPIC-ADD ON - Abnormal; Notable for the following:    Squamous Epithelial / LPF 6-30 (*)    Bacteria, UA FEW (*)    All other components within normal limits  I-STAT CG4 LACTIC ACID, ED - Abnormal; Notable for the following:    Lactic Acid, Venous 2.04 (*)    All other components within normal limits   I-STAT BETA HCG BLOOD, ED ([facility], [facility], [facility] ONLY) - Abnormal; Notable for the following:    I-stat hCG, quantitative 6.6 (*)    All other components within normal limits  CBG MONITORING, ED - Abnormal; Notable for the following:    Glucose-Capillary 324 (*)    All other components within normal limits  CBG MONITORING, ED - Abnormal; Notable for the following:    Glucose-Capillary 311 (*)    All other components within normal limits  CBG MONITORING, ED - Abnormal; Notable for the following:    Glucose-Capillary 289 (*)    All other components within normal limits  CBG MONITORING, ED - Abnormal; Notable for the following:    Glucose-Capillary 266 (*)    All other components within normal limits  CBG MONITORING, ED - Abnormal; Notable for the following:    Glucose-Capillary 271 (*)    All other components within normal limits  CBG MONITORING, ED - Abnormal; Notable for the following:    Glucose-Capillary 212 (*)    All other components within normal limits  CBG MONITORING, ED - Abnormal; Notable for the following:    Glucose-Capillary 219 (*)    All other components within normal limits  CBG MONITORING, ED - Abnormal; Notable for the following:    Glucose-Capillary 192 (*)    All other components within normal limits  AMMONIA  SALICYLATE LEVEL  BASIC METABOLIC PANEL  ETHANOL  I-STAT TROPOININ, ED  CBG MONITORING, ED    EKG  EKG Interpretation  Date/Time:  Friday January 02 2016 18:02:15 EST Ventricular Rate:  53 PR Interval:    QRS Duration: 92 QT Interval:  444 QTC Calculation: 417 R Axis:   80 Text Interpretation:  Sinus rhythm Anteroseptal infarct, age indeterminate HR 74 Confirmed by Kandis Mannan [phone]) on 01/02/2016 7:15:29 PM       Radiology Dg Chest 1 View  Result Date: 01/02/2016 CLINICAL DATA:  Unresponsive.  Possible overdose. EXAM: CHEST 1 VIEW COMPARISON:  05/01/2009 FINDINGS: Artifact overlies chest. Heart size is normal. Mediastinal shadows are  normal. The lungs are clear. No effusions. Chronic spinal curvature. IMPRESSION: No active disease. Electronically Signed   By: Paulina Fusi M.D.   On: 01/02/2016 16:35   Ct Head Wo Contrast  Result Date: 01/02/2016 CLINICAL DATA:  Altered mental status EXAM: CT HEAD WITHOUT CONTRAST TECHNIQUE: Contiguous axial images were obtained from the base of the skull through the vertex without intravenous contrast. COMPARISON:  04/27/2009 FINDINGS: Brain: No evidence of acute or remote  infarction, hemorrhage, hydrocephalus, extra-axial collection or mass lesion/mass effect. Vascular: No hyperdense vessel or unexpected calcification. Skull: No acute or aggressive finding. Sinuses/Orbits: Mild mucosal edema in the ethmoid sinuses. IMPRESSION: No acute intracranial finding or change from 2011. Electronically Signed   By: Marnee Spring M.D.   On: 01/02/2016 16:40    Procedures Procedures (including critical care time)  Medications Ordered in ED Medications  insulin regular (NOVOLIN R,HUMULIN R) 250 Units in sodium chloride 0.9 % 250 mL (1 Units/mL) infusion (61 Units/hr Intravenous Bolus from Bag 01/02/16 1823)  dextrose 10 % infusion ( Intravenous New Bag/Given 01/02/16 1820)  EPINEPHrine (ADRENALIN) 4 mg in dextrose 5 % 250 mL (0.016 mg/mL) infusion (not administered)  glucagon (human recombinant) (GLUCAGEN) injection 3 mg (not administered)  ondansetron (ZOFRAN) injection 4 mg (not administered)  0.9 %  sodium chloride infusion (not administered)  sodium chloride 0.9 % bolus 1,000 mL (not administered)  sodium chloride 0.9 % bolus 1,000 mL (0 mLs Intravenous Stopped 01/02/16 1716)  charcoal activated (NO SORBITOL) (ACTIDOSE-AQUA) suspension 50 g (50 g Oral Given 01/02/16 1619)  ondansetron (ZOFRAN) injection 4 mg (4 mg Intravenous Given 01/02/16 1618)  dextrose 50 % solution 30 g (30 g Intravenous Given 01/02/16 1822)     Initial Impression / Assessment and Plan / ED Course  I have reviewed the  triage vital signs and the nursing notes.  Pertinent labs & imaging results that were available during my care of the patient were reviewed by me and considered in my medical decision making (see chart for details).  Clinical Course     Patient is a 66 year old female with hitory of fibromyalgia bipolar depression presenting today with drug overdose. Patient is living in a trailer with her friend friend did not see her last night for dinner and went to the trailer today to check on her found her sleepy. Paramedics had no issues awakening her. However she did have an empty bottle of propanolol at bedside. Patient reports taking it. She will not talk to this M.D. But has talked to the EMS and the tech stating that maybe she took it earlier today around lunch.  Discussed with poison control. We'll give charcoal if patient can tolerate.   Will need to obs for 8 hours   5:36 PM Patient's HR is dropping, most recent 46, will initiative High Dose insulin. Consulted pharmacy.   Will discuss with intensivist, will need admission to ICU.  8:56 PM Patients HR improved with insulin. Now BP dropped. Will bolus again,   Will give glucagon, start epi drip.  After bolus, patietn improved. Will hang epi drip but not start.  Discussed with Sommers, admit to [facility], temporary admit orders placed.    CRITICAL CARE Performed by: Arlana Hove Total critical care time: 75 minutes Critical care time was exclusive of separately billable procedures and treating other patients. Critical care was necessary to treat or prevent imminent or life-threatening deterioration. Critical care was time spent personally by me on the following activities: development of treatment plan with patient and/or surrogate as well as nursing, discussions with consultants, evaluation of patient's response to treatment, examination of patient, obtaining history from patient or surrogate, ordering and performing treatments  and interventions, ordering and review of laboratory studies, ordering and review of radiographic studies, pulse oximetry and re-evaluation of patient's condition.   Final Clinical Impressions(s) / ED Diagnoses   Final diagnoses:  Suicide attempt by beta-adrenergic antagonist overdose, initial encounter [facility])    New Prescriptions  New Prescriptions   No medications on file     Annlouise Gerety Randall AnLyn Tayjon Halladay, MD 01/02/16 2056

## 2016-01-02 NOTE — ED Notes (Signed)
Bed: RESB Expected date:  Expected time:  Means of arrival:  Comments: EMS od atenalol

## 2016-01-02 NOTE — Progress Notes (Signed)
Entered in d/c instructions Please use the resources provided to you in emergency room by case manager to assist you're your choice of doctor for follow up  These [place] uninsured resources provide possible primary care providers, resources for discounted medications, housing, dental resources, affordable care act information, plus other resources for [place]   A referral for you has been sent to [facility] if you have not received a call in 3 days you may contact them Call Arieliz Andrianos at [phone] Tuesday-Friday [email or web address] 

## 2016-01-02 NOTE — Progress Notes (Signed)
Rn contacted by poison control and asked to pass along recommendations to MD. Meredith Pel MD... Following are the recommenations...  Bicarb bolus if code situation No amio in a code situation Can give intra lipids in a code situation Monitor urine output closely Monitor BS closely Recommends a bedside echo Repeat EKG to make sure QRS is not widening.

## 2016-01-03 ENCOUNTER — Observation Stay (HOSPITAL_BASED_OUTPATIENT_CLINIC_OR_DEPARTMENT_OTHER): Payer: Medicare HMO

## 2016-01-03 DIAGNOSIS — T447X2D Poisoning by beta-adrenoreceptor antagonists, intentional self-harm, subsequent encounter: Secondary | ICD-10-CM

## 2016-01-03 DIAGNOSIS — T447X2A Poisoning by beta-adrenoreceptor antagonists, intentional self-harm, initial encounter: Principal | ICD-10-CM

## 2016-01-03 DIAGNOSIS — E876 Hypokalemia: Secondary | ICD-10-CM | POA: Diagnosis present

## 2016-01-03 DIAGNOSIS — Q740 Other congenital malformations of upper limb(s), including shoulder girdle: Secondary | ICD-10-CM | POA: Diagnosis not present

## 2016-01-03 DIAGNOSIS — R410 Disorientation, unspecified: Secondary | ICD-10-CM | POA: Diagnosis present

## 2016-01-03 DIAGNOSIS — T50901A Poisoning by unspecified drugs, medicaments and biological substances, accidental (unintentional), initial encounter: Secondary | ICD-10-CM | POA: Diagnosis present

## 2016-01-03 DIAGNOSIS — R9431 Abnormal electrocardiogram [ECG] [EKG]: Secondary | ICD-10-CM | POA: Diagnosis not present

## 2016-01-03 DIAGNOSIS — Z794 Long term (current) use of insulin: Secondary | ICD-10-CM | POA: Diagnosis not present

## 2016-01-03 DIAGNOSIS — R001 Bradycardia, unspecified: Secondary | ICD-10-CM | POA: Diagnosis present

## 2016-01-03 DIAGNOSIS — F314 Bipolar disorder, current episode depressed, severe, without psychotic features: Secondary | ICD-10-CM | POA: Diagnosis not present

## 2016-01-03 DIAGNOSIS — F319 Bipolar disorder, unspecified: Secondary | ICD-10-CM | POA: Diagnosis present

## 2016-01-03 DIAGNOSIS — M797 Fibromyalgia: Secondary | ICD-10-CM | POA: Diagnosis present

## 2016-01-03 DIAGNOSIS — G9340 Encephalopathy, unspecified: Secondary | ICD-10-CM

## 2016-01-03 DIAGNOSIS — Q742 Other congenital malformations of lower limb(s), including pelvic girdle: Secondary | ICD-10-CM | POA: Diagnosis not present

## 2016-01-03 DIAGNOSIS — E162 Hypoglycemia, unspecified: Secondary | ICD-10-CM | POA: Diagnosis not present

## 2016-01-03 DIAGNOSIS — Z79899 Other long term (current) drug therapy: Secondary | ICD-10-CM

## 2016-01-03 DIAGNOSIS — T447X2S Poisoning by beta-adrenoreceptor antagonists, intentional self-harm, sequela: Secondary | ICD-10-CM | POA: Diagnosis not present

## 2016-01-03 DIAGNOSIS — R451 Restlessness and agitation: Secondary | ICD-10-CM | POA: Diagnosis present

## 2016-01-03 DIAGNOSIS — K5090 Crohn's disease, unspecified, without complications: Secondary | ICD-10-CM | POA: Diagnosis present

## 2016-01-03 DIAGNOSIS — T1491XA Suicide attempt, initial encounter: Secondary | ICD-10-CM

## 2016-01-03 DIAGNOSIS — K50919 Crohn's disease, unspecified, with unspecified complications: Secondary | ICD-10-CM | POA: Diagnosis not present

## 2016-01-03 LAB — CBC WITH DIFFERENTIAL/PLATELET
Basophils Absolute: 0 10*3/uL (ref 0.0–0.1)
Basophils Relative: 0 %
EOS ABS: 0.2 10*3/uL (ref 0.0–0.7)
Eosinophils Relative: 2 %
HEMATOCRIT: 36.9 % (ref 36.0–46.0)
HEMOGLOBIN: 12.6 g/dL (ref 12.0–15.0)
LYMPHS ABS: 2.2 10*3/uL (ref 0.7–4.0)
LYMPHS PCT: 20 %
MCH: 28.8 pg (ref 26.0–34.0)
MCHC: 34.1 g/dL (ref 30.0–36.0)
MCV: 84.4 fL (ref 78.0–100.0)
Monocytes Absolute: 1.1 10*3/uL — ABNORMAL HIGH (ref 0.1–1.0)
Monocytes Relative: 10 %
NEUTROS ABS: 7.5 10*3/uL (ref 1.7–7.7)
NEUTROS PCT: 68 %
Platelets: 341 10*3/uL (ref 150–400)
RBC: 4.37 MIL/uL (ref 3.87–5.11)
RDW: 13.1 % (ref 11.5–15.5)
WBC: 11 10*3/uL — AB (ref 4.0–10.5)

## 2016-01-03 LAB — GLUCOSE, CAPILLARY
GLUCOSE-CAPILLARY: 103 mg/dL — AB (ref 65–99)
GLUCOSE-CAPILLARY: 116 mg/dL — AB (ref 65–99)
GLUCOSE-CAPILLARY: 129 mg/dL — AB (ref 65–99)
GLUCOSE-CAPILLARY: 133 mg/dL — AB (ref 65–99)
GLUCOSE-CAPILLARY: 134 mg/dL — AB (ref 65–99)
GLUCOSE-CAPILLARY: 135 mg/dL — AB (ref 65–99)
GLUCOSE-CAPILLARY: 137 mg/dL — AB (ref 65–99)
GLUCOSE-CAPILLARY: 152 mg/dL — AB (ref 65–99)
GLUCOSE-CAPILLARY: 153 mg/dL — AB (ref 65–99)
GLUCOSE-CAPILLARY: 162 mg/dL — AB (ref 65–99)
GLUCOSE-CAPILLARY: 70 mg/dL (ref 65–99)
GLUCOSE-CAPILLARY: 75 mg/dL (ref 65–99)
GLUCOSE-CAPILLARY: 77 mg/dL (ref 65–99)
GLUCOSE-CAPILLARY: 80 mg/dL (ref 65–99)
GLUCOSE-CAPILLARY: 82 mg/dL (ref 65–99)
GLUCOSE-CAPILLARY: 82 mg/dL (ref 65–99)
GLUCOSE-CAPILLARY: 83 mg/dL (ref 65–99)
GLUCOSE-CAPILLARY: 86 mg/dL (ref 65–99)
GLUCOSE-CAPILLARY: 87 mg/dL (ref 65–99)
GLUCOSE-CAPILLARY: 88 mg/dL (ref 65–99)
GLUCOSE-CAPILLARY: 93 mg/dL (ref 65–99)
Glucose-Capillary: 107 mg/dL — ABNORMAL HIGH (ref 65–99)
Glucose-Capillary: 109 mg/dL — ABNORMAL HIGH (ref 65–99)
Glucose-Capillary: 113 mg/dL — ABNORMAL HIGH (ref 65–99)
Glucose-Capillary: 116 mg/dL — ABNORMAL HIGH (ref 65–99)
Glucose-Capillary: 121 mg/dL — ABNORMAL HIGH (ref 65–99)
Glucose-Capillary: 129 mg/dL — ABNORMAL HIGH (ref 65–99)
Glucose-Capillary: 135 mg/dL — ABNORMAL HIGH (ref 65–99)
Glucose-Capillary: 142 mg/dL — ABNORMAL HIGH (ref 65–99)
Glucose-Capillary: 143 mg/dL — ABNORMAL HIGH (ref 65–99)
Glucose-Capillary: 148 mg/dL — ABNORMAL HIGH (ref 65–99)
Glucose-Capillary: 149 mg/dL — ABNORMAL HIGH (ref 65–99)
Glucose-Capillary: 150 mg/dL — ABNORMAL HIGH (ref 65–99)
Glucose-Capillary: 166 mg/dL — ABNORMAL HIGH (ref 65–99)
Glucose-Capillary: 167 mg/dL — ABNORMAL HIGH (ref 65–99)
Glucose-Capillary: 169 mg/dL — ABNORMAL HIGH (ref 65–99)
Glucose-Capillary: 64 mg/dL — ABNORMAL LOW (ref 65–99)
Glucose-Capillary: 71 mg/dL (ref 65–99)
Glucose-Capillary: 75 mg/dL (ref 65–99)
Glucose-Capillary: 86 mg/dL (ref 65–99)
Glucose-Capillary: 89 mg/dL (ref 65–99)
Glucose-Capillary: 94 mg/dL (ref 65–99)

## 2016-01-03 LAB — RENAL FUNCTION PANEL
ANION GAP: 4 — AB (ref 5–15)
Albumin: 2.9 g/dL — ABNORMAL LOW (ref 3.5–5.0)
BUN: 6 mg/dL (ref 6–20)
CALCIUM: 7.8 mg/dL — AB (ref 8.9–10.3)
CHLORIDE: 115 mmol/L — AB (ref 101–111)
CO2: 21 mmol/L — AB (ref 22–32)
Creatinine, Ser: 0.95 mg/dL (ref 0.44–1.00)
GFR calc non Af Amer: >60 mL/min (ref >60)
GLUCOSE: 121 mg/dL — AB (ref 65–99)
POTASSIUM: 2.4 mmol/L — AB (ref 3.5–5.1)
Phosphorus: <1 mg/dL — CL (ref 2.5–4.6)
Sodium: 140 mmol/L (ref 135–145)

## 2016-01-03 LAB — MAGNESIUM: Magnesium: 1.4 mg/dL — ABNORMAL LOW (ref 1.7–2.4)

## 2016-01-03 MED ORDER — POTASSIUM CHLORIDE 2 MEQ/ML IV SOLN
30.0000 meq | Freq: Once | INTRAVENOUS | Status: AC
  Administered 2016-01-03: 30 meq via INTRAVENOUS
  Filled 2016-01-03: qty 15

## 2016-01-03 MED ORDER — MAGNESIUM SULFATE 4 GM/100ML IV SOLN
4.0000 g | Freq: Once | INTRAVENOUS | Status: AC
  Administered 2016-01-03: 4 g via INTRAVENOUS
  Filled 2016-01-03: qty 100

## 2016-01-03 MED ORDER — HALOPERIDOL LACTATE 5 MG/ML IJ SOLN
1.0000 mg | INTRAMUSCULAR | Status: DC | PRN
Start: 2016-01-03 — End: 2016-01-03
  Administered 2016-01-03: 1 mg via INTRAVENOUS

## 2016-01-03 MED ORDER — DEXTROSE 5 % IV SOLN
30.0000 mmol | Freq: Once | INTRAVENOUS | Status: AC
  Administered 2016-01-03: 30 mmol via INTRAVENOUS
  Filled 2016-01-03: qty 10

## 2016-01-03 MED ORDER — HALOPERIDOL LACTATE 5 MG/ML IJ SOLN
INTRAMUSCULAR | Status: AC
  Administered 2016-01-03: 1 mg via INTRAVENOUS
  Filled 2016-01-03: qty 1

## 2016-01-03 NOTE — Progress Notes (Signed)
PULMONARY / CRITICAL CARE MEDICINE   Name: Dana Wolfe MRN: [id number] DOB: December 20, 1950    ADMISSION DATE:  01/02/2016   REFERRING MD:  EDP  CHIEF COMPLAINT:  Intentional overdose (beta blocker)  BRIEF    Dana Wolfe is a 25F with history of Crohn's disease, bipolar disorder, fibromyalgia, and congential deformities (digits of hands/feet), who presents via EMS after being found down by a friend with a bottle of propranolol 120mg  beside her. Patient endorsed suicidal ideation and that this was an intentional overdose. She was initially monitored in the ED, but then became bradycardic and required high dose insulin with D10. She has had some intermittent hypotension requiring 2L fluid bolus in the ED.   She endorses active suicidal ideation. Denies pain. No shortness of breath / chest pain / cough / nausea / vomiting / urinary complaints.    SUBJECTIVE:  01/03/16: on epi gtt coming down On high dose insulin and dextrose at 367mL/h . Currently sleeping > sitter at bedside - says no issues with agitation and that patient is oriented but sleepy Low K and phos repleted. PEr RN sugars - are fine   VITAL SIGNS: BP (!) 121/55   Pulse (!) 56   Temp 98.3 F (36.8 C) (Oral)   Resp 17   Wt 62.5 kg (137 lb 12.6 oz)   SpO2 100%   HEMODYNAMICS:    VENTILATOR SETTINGS:    INTAKE / OUTPUT: I/O last 3 completed shifts: In: 6221.1 [I.V.:5081.1; IV Piggyback:1140] Out: 1725 [Urine:1725]  PHYSICAL EXAMINATION:  General Well nourished, well developed, sleepy  HEENT No gross abnormalities.   Pulmonary Clear to auscultation bilaterally with no wheezes, rales or ronchi. Good effort, symmetrical expansion.   Cardiovascular Normal rate, regular rhythm. S1, s2. No m/r/g. Distal pulses palpable.  Abdomen Soft, non-tender, non-distended, positive bowel sounds, no palpable organomegaly or masses. Normoresonant to percussion.  Musculoskeletal Congenital abnormalities of fingers and toes; otherwise  grossly normal.  Lymphatics No cervical, supraclavicular or axillary adenopathy.   Neurologic Grossly intact. No focal deficits. SLEEPY  Skin/Integuement No rash, no cyanosis, no clubbing. No edema     LABS: PULMONARY No results for input(s): PHART, PCO2ART, PO2ART, HCO3, TCO2, O2SAT in the last 168 hours.  Invalid input(s): PCO2, PO2  CBC  Recent Labs Lab 01/02/16 1536 01/03/16 0320  HGB 15.9* 12.6  HCT 46.8* 36.9  WBC 11.1* 11.0*  PLT 346 341    COAGULATION No results for input(s): INR in the last 168 hours.  CARDIAC  No results for input(s): TROPONINI in the last 168 hours. No results for input(s): PROBNP in the last 168 hours.   CHEMISTRY  Recent Labs Lab 01/02/16 1536 01/02/16 2030 01/03/16 0320  NA 143 140 140  K 4.3 2.5* 2.4*  CL 111 117* 115*  CO2 23 20* 21*  GLUCOSE 102* 236* 121*  BUN 10 10 6   CREATININE 1.09* 1.10* 0.95  CALCIUM 9.1 7.8* 7.8*  MG  --   --  1.4*  PHOS  --   --  <1.0*   CrCl cannot be calculated (Unknown ideal weight.).   LIVER  Recent Labs Lab 01/02/16 1536 01/03/16 0320  AST 28  --   ALT 28  --   ALKPHOS 84  --   BILITOT 1.5*  --   PROT 6.8  --   ALBUMIN 4.6 2.9*     INFECTIOUS  Recent Labs Lab 01/02/16 1554  LATICACIDVEN 2.04*     ENDOCRINE CBG (last 3)   Recent Labs  01/03/16 0918 01/03/16 0933 01/03/16 0946  GLUCAP 153* 135* 103*         IMAGING x48h  - image(s) personally visualized  -   highlighted in bold Dg Chest 1 View  Result Date: 01/02/2016 CLINICAL DATA:  Unresponsive.  Possible overdose. EXAM: CHEST 1 VIEW COMPARISON:  05/01/2009 FINDINGS: Artifact overlies chest. Heart size is normal. Mediastinal shadows are normal. The lungs are clear. No effusions. Chronic spinal curvature. IMPRESSION: No active disease. Electronically Signed   By: Paulina Fusi M.D.   On: 01/02/2016 16:35   Ct Head Wo Contrast  Result Date: 01/02/2016 CLINICAL DATA:  Altered mental status EXAM: CT HEAD  WITHOUT CONTRAST TECHNIQUE: Contiguous axial images were obtained from the base of the skull through the vertex without intravenous contrast. COMPARISON:  04/27/2009 FINDINGS: Brain: No evidence of acute or remote infarction, hemorrhage, hydrocephalus, extra-axial collection or mass lesion/mass effect. Vascular: No hyperdense vessel or unexpected calcification. Skull: No acute or aggressive finding. Sinuses/Orbits: Mild mucosal edema in the ethmoid sinuses. IMPRESSION: No acute intracranial finding or change from 2011. Electronically Signed   By: Marnee Spring M.D.   On: 01/02/2016 16:40       DISCUSSION: Dana Wolfe is a 89F with history of bipolar disorder, Crohn's, fibromyalgia who presents after intentional beta blocker overdose with suicidal intentions. In the ED she became bradycardic with some hemodynamic instability and is admitted to ICU for monitoring of high dose insulin therapy.   ASSESSMENT / PLAN:  CARDIOVASCULAR A:  Bradycardia 2/2 beta blocker toxicity - propranalol - short half life 4h Hypotension   - all improving 01/03/2016.  comng down on epi gt  P:  Continue high dose insulin therapy, glucagon, high dose dextrose infusion Epi gtt as needed for MAP > 65  PSYCHIATRIC A:  Active suicidal ideation/intent P: Suicide precautions Psych consult in AM - oprderd  PULMONARY A: No acute issues P:   Monitor  RENAL A:   Hypokalemia Hypomagnesemia Hypophosphatemia   - all repleted 01/03/2016  P:   Trend BMP  GASTROINTESTINAL A:   No acute issues Hx Crohns P:   NPO due to mild lethargy  HEMATOLOGIC A:   No acute issues; CBC appears hemoconcentrated P:  Fluid resuscitate Trend CBC No infectious symptoms; defer Abx  INFECTIOUS A:   No acute infection P:   monitor  ENDOCRINE A:   Glycemic control   P:   Maintain euglycemia while on insulin gtt w/ D10  NEUROLOGIC A:   Mild lethargy but oriented 01/03/2016  P:   RASS goal:  0   PSYCH A OD  Intentional P [place] Psych consult  GLOBAL RN calling poision control to find end point for insulin and dextreose Rx - sugars seem fine and bp improving   FAMILY  - Updates: No family available. Patient updated. Patient with limited decision-making capacity given active suicidal ideation; will remain FULL CODE pending psych eval.  - Inter-disciplinary family meet or Palliative Care meeting due by:  day 7  The patient is critically ill with multiple organ system failure and requires high complexity decision making for assessment and support, frequent evaluation and titration of therapies, advanced monitoring, review of radiographic studies and interpretation of complex data.   Critical Care Time devoted to patient care services, exclusive of separately billable procedures, described in this note is 37 minutes.    Dr. Kalman Shan, M.D., [facility].C.P Pulmonary and Critical Care Medicine Staff Physician  System [place] [facility] Pager: [phone]  [phone], If no answer or between  15:00h - 7:00h: call [phone]  01/03/2016 10:01 AM

## 2016-01-03 NOTE — Progress Notes (Addendum)
eLink Physician-Brief Progress Note Patient Name: Dana Wolfe DOB: 08/23/1950 MRN: [id number]   Date of Service  01/03/2016  HPI/Events of Note  Multiple issues: Lethargy - Post Hadol 1 mg IV. Question of apnea. Sat = 100% at this time. RR = 15 and 2. Blood glucose = 70.  eICU Interventions  Will order: 1. ABG now. 2. D/C Haldol. 3. Decrease insulin IV infusion from 15 units/hour to 7.5 units/hour.      Intervention Category Intermediate Interventions: Change in mental status - evaluation and management  Sommer,Steven Eugene 01/03/2016, 8:03 PM

## 2016-01-03 NOTE — Progress Notes (Signed)
ABG held at this time. Spoke with Dr. Marchelle Gearing. Patient responds to voice and answers appropriately; no distress noted. RT will continue to monitor patient/

## 2016-01-03 NOTE — Progress Notes (Signed)
eLink Physician-Brief Progress Note Patient Name: Dana MusaKaren Wolfe DOB: [birth year] MRN: [id number]   Date of Service  01/03/2016  HPI/Events of Note  Potassium 2.4, magnesium 1.4, phosphorous less than 1.0. Nearly completed infusion of potassium chloride.   eICU Interventions  1. K-Phos 30 mmol IV 2. Magnesium sulfate 4 g IV 3. KCl 30 mEq IV      Intervention Category Intermediate Interventions: Electrolyte abnormality - evaluation and management  Lawanda CousinsJennings Lailana Shira 01/03/2016, 4:34 AM

## 2016-01-03 NOTE — Progress Notes (Signed)
eLink Physician-Brief Progress Note Patient Name: Dana MusaKaren Bourassa DOB: 03/24/1950 MRN: [id number]   Date of Service  01/03/2016  HPI/Events of Note  Insulin IV infusion has been off from an hour d/t loss of IV access. Blood glucose = 87. HR = 55-60.  eICU Interventions  Will order: 1. D/C Insulin IV infusion.      Intervention Category Intermediate Interventions: Hyperglycemia - evaluation and treatment  Sommer,Steven Eugene 01/03/2016, 10:30 PM

## 2016-01-03 NOTE — Consult Note (Signed)
[facility] Consult   Reason for Consult:  Overdosed on Propranolol Referring Physician:  Dr. Chase Caller Patient Identification: Dana Wolfe MRN:  [id number] Principal Diagnosis: Suicide attempt by beta blocker overdose [facility]) Diagnosis:   Patient Active Problem List   Diagnosis Date Noted  . Suicide attempt by beta blocker overdose [facility]) [T44.7X2A] 01/02/2016    Priority: High  . Encephalopathy acute [G93.40] 01/03/2016  . Bipolar 1 disorder, depressed, severe ([place]) [F31.4] 01/03/2016    Total Time spent with patient: 1 hour  Subjective:   Dana Wolfe is a 65 y.o. female patient admitted with suicide attempt.  HPI:  Thanks for asking me to do a psychiatric consult on Dana Wolfe, a 65 year old female with history of Crohn's disease, fibromyalgia and congential deformities (digits of hands/feet). Patient reports long history of Bipolar depression. She was brought to the hospital by EMS after she was  found by a friend with an empty bottle of propranolol by her bedside. Patient states that "I am not happy that I survived, I should have been dead, what do I have to live for?''. She reports severe depression, recurrent suicidal thoughts, hopelessness, loneliness, social isolation and anhedonia. She reports being overwhelmed and stressed out. Current stressors include, lack of money, Relationship problem and believe life is not fair to her. She denies delusions, psychosis, drug/alcohol abuse. Patient is unable to contract for safety.   Past Psychiatric History: Bipolar disorder, Depression  Risk to Self: Is patient at risk for suicide?: Yes Risk to Others:   Prior Inpatient Therapy:   Prior Outpatient Therapy:    Past Medical History:  Past Medical History:  Diagnosis Date  . Anxiety   . Crohn disease ([place])   . Depression   . Fibromyalgia   . Herpes     Past Surgical History:  Procedure Laterality Date  . CESAREAN SECTION     x 2  . COLON RESECTION     for  chrons per pt   Family History: History reviewed. No pertinent family history. Family Psychiatric  History:  Social History:  History  Alcohol use Not on file    Comment: quit in 1998 per pt - states she was not a heavy drinker     History  Drug Use No    Social History   Social History  . Marital status: Single    Spouse name: N/A  . Number of children: N/A  . Years of education: N/A   Social History Main Topics  . Smoking status: Never Smoker  . Smokeless tobacco: Never Used  . Alcohol use None     Comment: quit in 1998 per pt - states she was not a heavy drinker  . Drug use: No  . Sexual activity: Not Asked   Other Topics Concern  . None   Social History Narrative  . None   Additional Social History:    Allergies:  No Known Allergies  Labs:  Results for orders placed or performed during the hospital encounter of 01/02/16 (from the past 48 hour(s))  CBC with Differential/Platelet     Status: Abnormal   Collection Time: 01/02/16  3:36 PM  Result Value Ref Range   WBC 11.1 (H) 4.0 - 10.5 K/uL   RBC 5.52 (H) 3.87 - 5.11 MIL/uL   Hemoglobin 15.9 (H) 12.0 - 15.0 g/dL   HCT 46.8 (H) 36.0 - 46.0 %   MCV 84.8 78.0 - 100.0 fL   MCH 28.8 26.0 - 34.0 pg  MCHC 34.0 30.0 - 36.0 g/dL   RDW 13.1 11.5 - 15.5 %   Platelets 346 150 - 400 K/uL   Neutrophils Relative % 64 %   Neutro Abs 7.2 1.7 - 7.7 K/uL   Lymphocytes Relative 27 %   Lymphs Abs 3.0 0.7 - 4.0 K/uL   Monocytes Relative 6 %   Monocytes Absolute 0.7 0.1 - 1.0 K/uL   Eosinophils Relative 2 %   Eosinophils Absolute 0.2 0.0 - 0.7 K/uL   Basophils Relative 1 %   Basophils Absolute 0.1 0.0 - 0.1 K/uL  Comprehensive metabolic panel     Status: Abnormal   Collection Time: 01/02/16  3:36 PM  Result Value Ref Range   Sodium 143 135 - 145 mmol/L   Potassium 4.3 3.5 - 5.1 mmol/L   Chloride 111 101 - 111 mmol/L   CO2 23 22 - 32 mmol/L   Glucose, Bld 102 (H) 65 - 99 mg/dL   BUN 10 6 - 20 mg/dL   Creatinine, Ser  1.09 (H) 0.44 - 1.00 mg/dL   Calcium 9.1 8.9 - 10.3 mg/dL   Total Protein 6.8 6.5 - 8.1 g/dL   Albumin 4.6 3.5 - 5.0 g/dL   AST 28 15 - 41 U/L   ALT 28 14 - 54 U/L   Alkaline Phosphatase 84 38 - 126 U/L   Total Bilirubin 1.5 (H) 0.3 - 1.2 mg/dL   GFR calc non Af Amer 52 (L) >60 mL/min   GFR calc Af Amer 60 (L) >60 mL/min    Comment: (NOTE) The eGFR has been calculated using the CKD EPI equation. This calculation has not been validated in all clinical situations. eGFR's persistently <60 mL/min signify possible Chronic Kidney Disease.    Anion gap 9 5 - 15  Ammonia     Status: None   Collection Time: 01/02/16  3:36 PM  Result Value Ref Range   Ammonia 32 9 - 35 umol/L  Acetaminophen level     Status: Abnormal   Collection Time: 01/02/16  3:36 PM  Result Value Ref Range   Acetaminophen (Tylenol), Serum <10 (L) 10 - 30 ug/mL    Comment:        THERAPEUTIC CONCENTRATIONS VARY SIGNIFICANTLY. A RANGE OF 10-30 ug/mL MAY BE AN EFFECTIVE CONCENTRATION FOR MANY PATIENTS. HOWEVER, SOME ARE BEST TREATED AT CONCENTRATIONS OUTSIDE THIS RANGE. ACETAMINOPHEN CONCENTRATIONS >150 ug/mL AT 4 HOURS AFTER INGESTION AND >50 ug/mL AT 12 HOURS AFTER INGESTION ARE OFTEN ASSOCIATED WITH TOXIC REACTIONS.   Salicylate level     Status: None   Collection Time: 01/02/16  3:36 PM  Result Value Ref Range   Salicylate Lvl <8.2 2.8 - 30.0 mg/dL  I-stat troponin, ED     Status: None   Collection Time: 01/02/16  3:52 PM  Result Value Ref Range   Troponin i, poc 0.01 0.00 - 0.08 ng/mL   Comment 3            Comment: Due to the release kinetics of cTnI, a negative result within the first hours of the onset of symptoms does not rule out myocardial infarction with certainty. If myocardial infarction is still suspected, repeat the test at appropriate intervals.   I-Stat Beta hCG blood, ED ([facility], [facility], [facility] only)     Status: Abnormal   Collection Time: 01/02/16  3:52 PM  Result Value Ref Range   I-stat  hCG, quantitative 6.6 (H) <5 mIU/mL   Comment 3  Comment:   GEST. AGE      CONC.  (mIU/mL)   <=1 WEEK        5 - 50     2 WEEKS       50 - 500     3 WEEKS       100 - 10,000     4 WEEKS     1,000 - 30,000        FEMALE AND NON-PREGNANT FEMALE:     LESS THAN 5 mIU/mL   I-Stat CG4 Lactic Acid, ED     Status: Abnormal   Collection Time: 01/02/16  3:54 PM  Result Value Ref Range   Lactic Acid, Venous 2.04 (HH) 0.5 - 1.9 mmol/L   Comment NOTIFIED PHYSICIAN   CBG monitoring, ED     Status: None   Collection Time: 01/02/16  5:40 PM  Result Value Ref Range   Glucose-Capillary 98 65 - 99 mg/dL  POC CBG, ED     Status: Abnormal   Collection Time: 01/02/16  6:44 PM  Result Value Ref Range   Glucose-Capillary 324 (H) 65 - 99 mg/dL  CBG monitoring, ED     Status: Abnormal   Collection Time: 01/02/16  7:02 PM  Result Value Ref Range   Glucose-Capillary 311 (H) 65 - 99 mg/dL  CBG monitoring, ED     Status: Abnormal   Collection Time: 01/02/16  7:23 PM  Result Value Ref Range   Glucose-Capillary 289 (H) 65 - 99 mg/dL  CBG monitoring, ED     Status: Abnormal   Collection Time: 01/02/16  7:36 PM  Result Value Ref Range   Glucose-Capillary 266 (H) 65 - 99 mg/dL  CBG monitoring, ED     Status: Abnormal   Collection Time: 01/02/16  7:47 PM  Result Value Ref Range   Glucose-Capillary 271 (H) 65 - 99 mg/dL  Urinalysis, Routine w reflex microscopic (not at [facility])     Status: Abnormal   Collection Time: 01/02/16  8:07 PM  Result Value Ref Range   Color, Urine YELLOW YELLOW   APPearance CLOUDY (A) CLEAR   Specific Gravity, Urine 1.017 1.005 - 1.030   pH 6.0 5.0 - 8.0   Glucose, UA >1000 (A) NEGATIVE mg/dL   Hgb urine dipstick NEGATIVE NEGATIVE   Bilirubin Urine NEGATIVE NEGATIVE   Ketones, ur NEGATIVE NEGATIVE mg/dL   Protein, ur NEGATIVE NEGATIVE mg/dL   Nitrite NEGATIVE NEGATIVE   Leukocytes, UA SMALL (A) NEGATIVE  Rapid urine drug screen (hospital performed)     Status: Abnormal    Collection Time: 01/02/16  8:07 PM  Result Value Ref Range   Opiates NONE DETECTED NONE DETECTED   Cocaine NONE DETECTED NONE DETECTED   Benzodiazepines POSITIVE (A) NONE DETECTED   Amphetamines NONE DETECTED NONE DETECTED   Tetrahydrocannabinol NONE DETECTED NONE DETECTED   Barbiturates NONE DETECTED NONE DETECTED    Comment:        DRUG SCREEN FOR MEDICAL PURPOSES ONLY.  IF CONFIRMATION IS NEEDED FOR ANY PURPOSE, NOTIFY LAB WITHIN 5 DAYS.        LOWEST DETECTABLE LIMITS FOR URINE DRUG SCREEN Drug Class       Cutoff (ng/mL) Amphetamine      1000 Barbiturate      200 Benzodiazepine   800 Tricyclics       349 Opiates          300 Cocaine          300 THC  50   Urine microscopic-add on     Status: Abnormal   Collection Time: 01/02/16  8:07 PM  Result Value Ref Range   Squamous Epithelial / LPF 6-30 (A) NONE SEEN   WBC, UA 6-30 0 - 5 WBC/hpf   RBC / HPF 0-5 0 - 5 RBC/hpf   Bacteria, UA FEW (A) NONE SEEN  CBG monitoring, ED     Status: Abnormal   Collection Time: 01/02/16  8:13 PM  Result Value Ref Range   Glucose-Capillary 212 (H) 65 - 99 mg/dL  CBG monitoring, ED     Status: Abnormal   Collection Time: 01/02/16  8:27 PM  Result Value Ref Range   Glucose-Capillary 219 (H) 65 - 99 mg/dL  Basic metabolic panel     Status: Abnormal   Collection Time: 01/02/16  8:30 PM  Result Value Ref Range   Sodium 140 135 - 145 mmol/L   Potassium 2.5 (LL) 3.5 - 5.1 mmol/L    Comment: DELTA CHECK NOTED REPEATED TO VERIFY CRITICAL RESULT CALLED TO, READ BACK BY AND VERIFIED WITH: P.BECK,RN 2200 01/02/16 W.SHEA    Chloride 117 (H) 101 - 111 mmol/L   CO2 20 (L) 22 - 32 mmol/L   Glucose, Bld 236 (H) 65 - 99 mg/dL   BUN 10 6 - 20 mg/dL   Creatinine, Ser 1.10 (H) 0.44 - 1.00 mg/dL   Calcium 7.8 (L) 8.9 - 10.3 mg/dL   GFR calc non Af Amer 51 (L) >60 mL/min   GFR calc Af Amer 59 (L) >60 mL/min    Comment: (NOTE) The eGFR has been calculated using the CKD EPI  equation. This calculation has not been validated in all clinical situations. eGFR's persistently <60 mL/min signify possible Chronic Kidney Disease.    Anion gap 3 (L) 5 - 15  Ethanol     Status: None   Collection Time: 01/02/16  8:30 PM  Result Value Ref Range   Alcohol, Ethyl (B) <5 <5 mg/dL    Comment:        LOWEST DETECTABLE LIMIT FOR SERUM ALCOHOL IS 5 mg/dL FOR MEDICAL PURPOSES ONLY   CBG monitoring, ED     Status: Abnormal   Collection Time: 01/02/16  8:51 PM  Result Value Ref Range   Glucose-Capillary 192 (H) 65 - 99 mg/dL  CBG monitoring, ED     Status: Abnormal   Collection Time: 01/02/16  9:09 PM  Result Value Ref Range   Glucose-Capillary 200 (H) 65 - 99 mg/dL  CBG monitoring, ED     Status: Abnormal   Collection Time: 01/02/16  9:23 PM  Result Value Ref Range   Glucose-Capillary 191 (H) 65 - 99 mg/dL  Glucose, capillary     Status: Abnormal   Collection Time: 01/02/16  9:46 PM  Result Value Ref Range   Glucose-Capillary 195 (H) 65 - 99 mg/dL   Comment 1 Notify RN    Comment 2 Document in Chart   MRSA PCR Screening     Status: None   Collection Time: 01/02/16  9:55 PM  Result Value Ref Range   MRSA by PCR NEGATIVE NEGATIVE    Comment:        The GeneXpert MRSA Assay (FDA approved for NASAL specimens only), is one component of a comprehensive MRSA colonization surveillance program. It is not intended to diagnose MRSA infection nor to guide or monitor treatment for MRSA infections.   Glucose, capillary     Status: Abnormal   Collection Time:  01/02/16 10:23 PM  Result Value Ref Range   Glucose-Capillary 169 (H) 65 - 99 mg/dL  Glucose, capillary     Status: Abnormal   Collection Time: 01/02/16 10:43 PM  Result Value Ref Range   Glucose-Capillary 165 (H) 65 - 99 mg/dL  Glucose, capillary     Status: Abnormal   Collection Time: 01/02/16 11:00 PM  Result Value Ref Range   Glucose-Capillary 144 (H) 65 - 99 mg/dL  Glucose, capillary     Status:  Abnormal   Collection Time: 01/02/16 11:22 PM  Result Value Ref Range   Glucose-Capillary 127 (H) 65 - 99 mg/dL  Glucose, capillary     Status: Abnormal   Collection Time: 01/02/16 11:52 PM  Result Value Ref Range   Glucose-Capillary 140 (H) 65 - 99 mg/dL  Glucose, capillary     Status: Abnormal   Collection Time: 01/03/16 12:18 AM  Result Value Ref Range   Glucose-Capillary 137 (H) 65 - 99 mg/dL  Glucose, capillary     Status: Abnormal   Collection Time: 01/03/16  1:01 AM  Result Value Ref Range   Glucose-Capillary 121 (H) 65 - 99 mg/dL  Glucose, capillary     Status: Abnormal   Collection Time: 01/03/16  1:47 AM  Result Value Ref Range   Glucose-Capillary 116 (H) 65 - 99 mg/dL  Glucose, capillary     Status: Abnormal   Collection Time: 01/03/16  2:46 AM  Result Value Ref Range   Glucose-Capillary 129 (H) 65 - 99 mg/dL  Renal function panel     Status: Abnormal   Collection Time: 01/03/16  3:20 AM  Result Value Ref Range   Sodium 140 135 - 145 mmol/L   Potassium 2.4 (LL) 3.5 - 5.1 mmol/L    Comment: CRITICAL RESULT CALLED TO, READ BACK BY AND VERIFIED WITH: P.BECK,RN 0418 01/03/16 W.SHEA    Chloride 115 (H) 101 - 111 mmol/L   CO2 21 (L) 22 - 32 mmol/L   Glucose, Bld 121 (H) 65 - 99 mg/dL   BUN 6 6 - 20 mg/dL   Creatinine, Ser 0.95 0.44 - 1.00 mg/dL   Calcium 7.8 (L) 8.9 - 10.3 mg/dL   Phosphorus <1.0 (LL) 2.5 - 4.6 mg/dL    Comment: CRITICAL RESULT CALLED TO, READ BACK BY AND VERIFIED WITH: P.BECK,RN 9024 01/03/16 W.SHEA    Albumin 2.9 (L) 3.5 - 5.0 g/dL   GFR calc non Af Amer >60 >60 mL/min   GFR calc Af Amer >60 >60 mL/min    Comment: (NOTE) The eGFR has been calculated using the CKD EPI equation. This calculation has not been validated in all clinical situations. eGFR's persistently <60 mL/min signify possible Chronic Kidney Disease.    Anion gap 4 (L) 5 - 15  Magnesium     Status: Abnormal   Collection Time: 01/03/16  3:20 AM  Result Value Ref Range    Magnesium 1.4 (L) 1.7 - 2.4 mg/dL  CBC with Differential/Platelet     Status: Abnormal   Collection Time: 01/03/16  3:20 AM  Result Value Ref Range   WBC 11.0 (H) 4.0 - 10.5 K/uL   RBC 4.37 3.87 - 5.11 MIL/uL   Hemoglobin 12.6 12.0 - 15.0 g/dL    Comment: DELTA CHECK NOTED REPEATED TO VERIFY    HCT 36.9 36.0 - 46.0 %   MCV 84.4 78.0 - 100.0 fL   MCH 28.8 26.0 - 34.0 pg   MCHC 34.1 30.0 - 36.0 g/dL   RDW 13.1  11.5 - 15.5 %   Platelets 341 150 - 400 K/uL   Neutrophils Relative % 68 %   Neutro Abs 7.5 1.7 - 7.7 K/uL   Lymphocytes Relative 20 %   Lymphs Abs 2.2 0.7 - 4.0 K/uL   Monocytes Relative 10 %   Monocytes Absolute 1.1 (H) 0.1 - 1.0 K/uL   Eosinophils Relative 2 %   Eosinophils Absolute 0.2 0.0 - 0.7 K/uL   Basophils Relative 0 %   Basophils Absolute 0.0 0.0 - 0.1 K/uL  Glucose, capillary     Status: Abnormal   Collection Time: 01/03/16  3:53 AM  Result Value Ref Range   Glucose-Capillary 133 (H) 65 - 99 mg/dL  Glucose, capillary     Status: Abnormal   Collection Time: 01/03/16  4:47 AM  Result Value Ref Range   Glucose-Capillary 116 (H) 65 - 99 mg/dL  Glucose, capillary     Status: Abnormal   Collection Time: 01/03/16  5:09 AM  Result Value Ref Range   Glucose-Capillary 169 (H) 65 - 99 mg/dL  Glucose, capillary     Status: Abnormal   Collection Time: 01/03/16  5:36 AM  Result Value Ref Range   Glucose-Capillary 148 (H) 65 - 99 mg/dL  Glucose, capillary     Status: Abnormal   Collection Time: 01/03/16  6:52 AM  Result Value Ref Range   Glucose-Capillary 166 (H) 65 - 99 mg/dL  Glucose, capillary     Status: Abnormal   Collection Time: 01/03/16  7:23 AM  Result Value Ref Range   Glucose-Capillary 167 (H) 65 - 99 mg/dL   Comment 1 Notify RN    Comment 2 Document in Chart   Glucose, capillary     Status: Abnormal   Collection Time: 01/03/16  7:44 AM  Result Value Ref Range   Glucose-Capillary 134 (H) 65 - 99 mg/dL   Comment 1 Notify RN    Comment 2 Document in  Chart   Glucose, capillary     Status: Abnormal   Collection Time: 01/03/16  8:00 AM  Result Value Ref Range   Glucose-Capillary 152 (H) 65 - 99 mg/dL   Comment 1 Notify RN    Comment 2 Document in Chart   Glucose, capillary     Status: Abnormal   Collection Time: 01/03/16  8:16 AM  Result Value Ref Range   Glucose-Capillary 162 (H) 65 - 99 mg/dL   Comment 1 Notify RN    Comment 2 Document in Chart   Glucose, capillary     Status: Abnormal   Collection Time: 01/03/16  8:31 AM  Result Value Ref Range   Glucose-Capillary 150 (H) 65 - 99 mg/dL   Comment 1 Notify RN    Comment 2 Document in Chart   Glucose, capillary     Status: Abnormal   Collection Time: 01/03/16  9:05 AM  Result Value Ref Range   Glucose-Capillary 142 (H) 65 - 99 mg/dL   Comment 1 Notify RN   Glucose, capillary     Status: Abnormal   Collection Time: 01/03/16  9:18 AM  Result Value Ref Range   Glucose-Capillary 153 (H) 65 - 99 mg/dL   Comment 1 Notify RN   Glucose, capillary     Status: Abnormal   Collection Time: 01/03/16  9:33 AM  Result Value Ref Range   Glucose-Capillary 135 (H) 65 - 99 mg/dL  Glucose, capillary     Status: Abnormal   Collection Time: 01/03/16  9:46 AM  Result Value Ref Range   Glucose-Capillary 103 (H) 65 - 99 mg/dL   Comment 1 Notify RN   Glucose, capillary     Status: Abnormal   Collection Time: 01/03/16 10:03 AM  Result Value Ref Range   Glucose-Capillary 143 (H) 65 - 99 mg/dL  Glucose, capillary     Status: Abnormal   Collection Time: 01/03/16 10:21 AM  Result Value Ref Range   Glucose-Capillary 129 (H) 65 - 99 mg/dL  Glucose, capillary     Status: Abnormal   Collection Time: 01/03/16 10:35 AM  Result Value Ref Range   Glucose-Capillary 135 (H) 65 - 99 mg/dL  Glucose, capillary     Status: Abnormal   Collection Time: 01/03/16 10:47 AM  Result Value Ref Range   Glucose-Capillary 109 (H) 65 - 99 mg/dL  Glucose, capillary     Status: Abnormal   Collection Time: 01/03/16  11:16 AM  Result Value Ref Range   Glucose-Capillary 113 (H) 65 - 99 mg/dL   Comment 1 Notify RN   Glucose, capillary     Status: None   Collection Time: 01/03/16 11:34 AM  Result Value Ref Range   Glucose-Capillary 83 65 - 99 mg/dL   Comment 1 Notify RN   Glucose, capillary     Status: None   Collection Time: 01/03/16 11:51 AM  Result Value Ref Range   Glucose-Capillary 75 65 - 99 mg/dL  Glucose, capillary     Status: None   Collection Time: 01/03/16 12:03 PM  Result Value Ref Range   Glucose-Capillary 80 65 - 99 mg/dL  Glucose, capillary     Status: None   Collection Time: 01/03/16 12:20 PM  Result Value Ref Range   Glucose-Capillary 75 65 - 99 mg/dL  Glucose, capillary     Status: None   Collection Time: 01/03/16 12:31 PM  Result Value Ref Range   Glucose-Capillary 93 65 - 99 mg/dL   Comment 1 Notify RN    Comment 2 Document in Chart   Glucose, capillary     Status: None   Collection Time: 01/03/16 12:50 PM  Result Value Ref Range   Glucose-Capillary 71 65 - 99 mg/dL   Comment 1 Notify RN    Comment 2 Document in Chart   Glucose, capillary     Status: Abnormal   Collection Time: 01/03/16  1:03 PM  Result Value Ref Range   Glucose-Capillary 64 (L) 65 - 99 mg/dL  Glucose, capillary     Status: None   Collection Time: 01/03/16  1:20 PM  Result Value Ref Range   Glucose-Capillary 86 65 - 99 mg/dL    Current Facility-Administered Medications  Medication Dose Route Frequency Provider Last Rate Last Dose  . 0.9 %  sodium chloride infusion   Intravenous Once Courteney Lyn Mackuen, MD      . dextrose 10 % infusion   Intravenous Continuous Dannielle Burn, MD 350 mL/hr at 01/03/16 1306    . EPINEPHrine (ADRENALIN) 4 mg in dextrose 5 % 250 mL (0.016 mg/mL) infusion  0.5-20 mcg/min Intravenous Titrated Courteney Lyn Mackuen, MD 20.6 mL/hr at 01/03/16 1308 5.5 mcg/min at 01/03/16 1308  . glucagon (human recombinant) (GLUCAGEN) injection 3 mg  3 mg Intravenous Once Courteney  Lyn Mackuen, MD      . insulin regular (NOVOLIN R,HUMULIN R) 1 Units/mL in sodium chloride 0.9 % 250 mL infusion  61 Units/hr Intravenous Continuous Courteney Lyn Mackuen, MD 15 mL/hr at 01/03/16 1154 15 Units/hr at 01/03/16 1154  .  ondansetron (ZOFRAN) injection 4 mg  4 mg Intravenous Once Courteney Lyn Mackuen, MD      . sodium chloride 0.9 % bolus 1,000 mL  1,000 mL Intravenous Once Courteney Lyn Mackuen, MD        Musculoskeletal: Strength & Muscle Tone: within normal limits Gait & Station: not tested Patient leans: N/A  Psychiatric Specialty Exam: Physical Exam  Psychiatric: Her speech is delayed. She is slowed and withdrawn. Cognition and memory are normal. She expresses impulsivity. She exhibits a depressed mood. She expresses suicidal ideation. She expresses suicidal plans.    Review of Systems  Constitutional: Positive for malaise/fatigue.  HENT: Negative.   Eyes: Negative.   Respiratory: Negative.   Cardiovascular: Negative.   Gastrointestinal: Negative.   Genitourinary: Negative.   Musculoskeletal: Negative.   Skin: Negative.   Neurological: Negative.   Endo/Heme/Allergies: Negative.   Psychiatric/Behavioral: Positive for depression and suicidal ideas.    Blood pressure (!) 119/53, pulse (!) 57, temperature 98.3 F (36.8 C), temperature source Oral, resp. rate 14, weight 62.5 kg (137 lb 12.6 oz), SpO2 100 %.There is no height or weight on file to calculate BMI.  General Appearance: Casual  Eye Contact:  Minimal  Speech:  Slow  Volume:  Decreased  Mood:  Depressed, Dysphoric, Hopeless and Worthless  Affect:  Constricted  Thought Process:  Coherent  Orientation:  Full (Time, Place, and Person)  Thought Content:  Logical  Suicidal Thoughts:  Yes.  with intent/plan  Homicidal Thoughts:  No  Memory:  Immediate;   Good Recent;   Good Remote;   Good  Judgement:  Poor  Insight:  Lacking  Psychomotor Activity:  Psychomotor Retardation  Concentration:  Concentration:  Fair and Attention Span: Fair  Recall:  Good  Fund of Knowledge:  Good  Language:  Good  Akathisia:  No  Handed:  Right  AIMS (if indicated):     Assets:  Communication Skills  ADL's:  Intact  Cognition:  WNL  Sleep:        Treatment Plan Summary: Daily contact with patient to assess and evaluate symptoms and progress in treatment -Hold all psychiatric medications until patient is medically stable -Continue 1:1 sitter for safety -Patient will benefit from Geriatric inpatient admission when she medically stable  Disposition: Recommend psychiatric Inpatient admission when medically cleared. Supportive therapy provided about ongoing stressors.  Corena Pilgrim, MD 01/03/2016 1:31 PM

## 2016-01-03 NOTE — Progress Notes (Addendum)
eLink Physician-Brief Progress Note Patient Name: Dana Wolfe DOB: Feb 19, 1950 MRN: [id number]   Date of Service  01/03/2016  HPI/Events of Note  Agitated Delirium. QTc interval = 0.46.  eICU Interventions  Will order: 1. 4 point restraints.  2. Haldol 1 mg IV Q 3 hour PRN agitated delirium.      Intervention Category Major Interventions: Delirium, psychosis, severe agitation - evaluation and management  [name] 01/03/2016, 7:39 PM

## 2016-01-03 NOTE — Progress Notes (Signed)
CRITICAL VALUE ALERT  Critical value received:  Phosphorus <1.0  Date of notification:  01/03/16  Time of notification:  0417  Critical value read back: yes  Nurse who received alert:  Ezequiel KayserPaige Beck, RN  MD notified (1st page):  Dr. Jamison NeighborNestor  Time of first page:  [phone]

## 2016-01-03 NOTE — Progress Notes (Signed)
CRITICAL VALUE ALERT  Critical value received:  k 2.4  Date of notification:  01/03/16  Time of notification:  0417  Critical value read back: yes  Nurse who received alert:  Ezequiel Kayser, RN  MD notified (1st page): Dr. Jamison Neighbor   Time of first page:  [phone]

## 2016-01-03 NOTE — Progress Notes (Signed)
eLink Physician-Brief Progress Note Patient Name: Juliah Gradillas DOB: [birth year] MRN: [id number]   Date of Service  01/03/2016  HPI/Events of Note  Patient admitted with intentional beta blocker overdose. Suicide precautions ordered with sitter at bedside. Currently on D10 infusion and insulin drip. Glucose stable. Evaluated by intensivist.   eICU Interventions  1. Goal blood glucose clarified 2. No titration for insulin or D10 infusion 3. Continuing plan of care per intensivist      Intervention Category Major Interventions: Other:  Lawanda Cousins 01/03/2016, 12:09 AM

## 2016-01-03 NOTE — Progress Notes (Signed)
  Echocardiogram 2D Echocardiogram has been performed.  Cathie Beams 01/03/2016, 9:17 AM

## 2016-01-03 NOTE — Progress Notes (Signed)
RT attempted ABG x's 2 without success.  Pt tearful and unwilling to let 2nd RT attempt at this time.  RN aware, RT to monitor and assess as needed.

## 2016-01-03 NOTE — Progress Notes (Signed)
 poison control recommenced to this RN to attempt to wean down D10 and Insulin first to 0 then wean down Epi to 0.  Will monitor vital signs very closely while weaning.  Informed Dr Marchelle Gearing about poison control recommendations and he said to proceed with the weaning.  Erick Blinks, RN

## 2016-01-04 DIAGNOSIS — G9340 Encephalopathy, unspecified: Secondary | ICD-10-CM

## 2016-01-04 LAB — GLUCOSE, CAPILLARY
GLUCOSE-CAPILLARY: 158 mg/dL — AB (ref 65–99)
GLUCOSE-CAPILLARY: 141 mg/dL — AB (ref 65–99)
GLUCOSE-CAPILLARY: 125 mg/dL — AB (ref 65–99)
GLUCOSE-CAPILLARY: 142 mg/dL — AB (ref 65–99)
GLUCOSE-CAPILLARY: 112 mg/dL — AB (ref 65–99)
GLUCOSE-CAPILLARY: 109 mg/dL — AB (ref 65–99)
GLUCOSE-CAPILLARY: 114 mg/dL — AB (ref 65–99)
GLUCOSE-CAPILLARY: 113 mg/dL — AB (ref 65–99)
GLUCOSE-CAPILLARY: 112 mg/dL — AB (ref 65–99)
GLUCOSE-CAPILLARY: 86 mg/dL (ref 65–99)
GLUCOSE-CAPILLARY: 94 mg/dL (ref 65–99)
GLUCOSE-CAPILLARY: 91 mg/dL (ref 65–99)
Glucose-Capillary: 164 mg/dL — ABNORMAL HIGH (ref 65–99)
Glucose-Capillary: 149 mg/dL — ABNORMAL HIGH (ref 65–99)
Glucose-Capillary: 135 mg/dL — ABNORMAL HIGH (ref 65–99)
Glucose-Capillary: 124 mg/dL — ABNORMAL HIGH (ref 65–99)
Glucose-Capillary: 96 mg/dL (ref 65–99)
Glucose-Capillary: 111 mg/dL — ABNORMAL HIGH (ref 65–99)
Glucose-Capillary: 106 mg/dL — ABNORMAL HIGH (ref 65–99)
Glucose-Capillary: 119 mg/dL — ABNORMAL HIGH (ref 65–99)
Glucose-Capillary: 85 mg/dL (ref 65–99)
Glucose-Capillary: 92 mg/dL (ref 65–99)
Glucose-Capillary: 89 mg/dL (ref 65–99)

## 2016-01-04 LAB — CBC WITH DIFFERENTIAL/PLATELET
BASOS ABS: 0.1 10*3/uL (ref 0.0–0.1)
BASOS PCT: 1 %
Eosinophils Absolute: 0.3 10*3/uL (ref 0.0–0.7)
Eosinophils Relative: 2 %
HEMATOCRIT: 39.6 % (ref 36.0–46.0)
HEMOGLOBIN: 13.5 g/dL (ref 12.0–15.0)
LYMPHS PCT: 17 %
Lymphs Abs: 2 10*3/uL (ref 0.7–4.0)
MCH: 28.5 pg (ref 26.0–34.0)
MCHC: 34.1 g/dL (ref 30.0–36.0)
MCV: 83.5 fL (ref 78.0–100.0)
MONO ABS: 1.1 10*3/uL — AB (ref 0.1–1.0)
Monocytes Relative: 10 %
NEUTROS ABS: 8.3 10*3/uL — AB (ref 1.7–7.7)
NEUTROS PCT: 70 %
Platelets: 276 10*3/uL (ref 150–400)
RBC: 4.74 MIL/uL (ref 3.87–5.11)
RDW: 13.3 % (ref 11.5–15.5)
WBC: 11.7 10*3/uL — AB (ref 4.0–10.5)

## 2016-01-04 LAB — BASIC METABOLIC PANEL
ANION GAP: 7 (ref 5–15)
BUN: <5 mg/dL — ABNORMAL LOW (ref 6–20)
CALCIUM: 7.7 mg/dL — AB (ref 8.9–10.3)
CO2: 19 mmol/L — ABNORMAL LOW (ref 22–32)
Chloride: 114 mmol/L — ABNORMAL HIGH (ref 101–111)
Creatinine, Ser: 0.91 mg/dL (ref 0.44–1.00)
GLUCOSE: 149 mg/dL — AB (ref 65–99)
POTASSIUM: 3.2 mmol/L — AB (ref 3.5–5.1)
Sodium: 140 mmol/L (ref 135–145)

## 2016-01-04 LAB — MAGNESIUM: Magnesium: 1.7 mg/dL (ref 1.7–2.4)

## 2016-01-04 LAB — TROPONIN I

## 2016-01-04 LAB — LACTIC ACID, PLASMA
LACTIC ACID, VENOUS: 2.8 mmol/L — AB (ref 0.5–1.9)
LACTIC ACID, VENOUS: 3 mmol/L — AB (ref 0.5–1.9)

## 2016-01-04 LAB — PHOSPHORUS: PHOSPHORUS: 2.4 mg/dL — AB (ref 2.5–4.6)

## 2016-01-04 MED ORDER — SODIUM CHLORIDE 0.9 % IV SOLN
30.0000 meq | Freq: Once | INTRAVENOUS | Status: AC
  Administered 2016-01-04: 30 meq via INTRAVENOUS
  Filled 2016-01-04: qty 15

## 2016-01-04 MED ORDER — ENSURE ENLIVE PO LIQD: 237.0000 mL | Freq: Two times a day (BID) | ORAL | Status: DC

## 2016-01-04 MED ORDER — SODIUM CHLORIDE 0.9 % IV BOLUS (SEPSIS)
1000.0000 mL | Freq: Once | INTRAVENOUS | Status: AC
  Administered 2016-01-04: 1000 mL via INTRAVENOUS

## 2016-01-04 MED ORDER — DEXTROSE-NACL 5-0.9 % IV SOLN
INTRAVENOUS | Status: DC
  Administered 2016-01-04 – 2016-01-06 (×4): via INTRAVENOUS

## 2016-01-04 MED ORDER — MAGNESIUM SULFATE IN D5W 1-5 GM/100ML-% IV SOLN
1.0000 g | Freq: Once | INTRAVENOUS | Status: AC
  Administered 2016-01-04: 1 g via INTRAVENOUS
  Filled 2016-01-04: qty 100

## 2016-01-04 MED ORDER — DEXTROSE 5 % IV SOLN
10.0000 mmol | Freq: Once | INTRAVENOUS | Status: AC
  Administered 2016-01-04: 10 mmol via INTRAVENOUS
  Filled 2016-01-04: qty 3.33

## 2016-01-04 MED ORDER — SODIUM CHLORIDE 0.9 % IV BOLUS (SEPSIS)
500.0000 mL | Freq: Once | INTRAVENOUS | Status: AC
  Administered 2016-01-04: 500 mL via INTRAVENOUS

## 2016-01-04 NOTE — Progress Notes (Signed)
Paged MD regarding patient's increasing lactic acid to 3.0.  MD added a NS bolus and adjusted maintenance fluids.  Will continue to monitor.

## 2016-01-04 NOTE — Progress Notes (Signed)
PULMONARY / CRITICAL CARE MEDICINE   Name: Dana Wolfe MRN: [id number] DOB: [birth year]    ADMISSION DATE:  01/02/2016   REFERRING MD:  EDP  CHIEF COMPLAINT:  Intentional overdose (beta blocker)  BRIEF    Dana Wolfe is a 56F with history of Crohn's disease, bipolar disorder, fibromyalgia, and congential deformities (digits of hands/feet), who presents via EMS after being found down by a friend with a bottle of propranolol 120mg  beside her. Patient endorsed suicidal ideation and that this was an intentional overdose. She was initially monitored in the ED, but then became bradycardic and required high dose insulin with D10. She has had some intermittent hypotension requiring 2L fluid bolus in the ED.   She endorses active suicidal ideation. Denies pain. No shortness of breath / chest pain / cough / nausea / vomiting / urinary complaints.      01/03/16: on epi gtt coming down On high dose insulin and dextrose at 371mL/h . Currently sleeping > sitter at bedside - says no issues with agitation and that patient is oriented but sleepy Low K and phos repleted. PEr RN sugars - are fine   SUBJECTIVE/OVERNIGHT/INTERVAL HX 11/19 - off epi gtt, and insulin gtt. D10 at low rate 75cc/h Seen by psych - and admitted to intentional OD due to [facility] dpression.  Lactic acid slightly up this AM though. Sugars doing ok on CBG check q1h per RN  VITAL SIGNS: BP (!) 130/49   Pulse 64   Temp 98 F (36.7 C) (Oral)   Resp 16   Wt 62.5 kg (137 lb 12.6 oz)   SpO2 96%   HEMODYNAMICS:    VENTILATOR SETTINGS: FiO2 (%):  [50 %] 50 %  INTAKE / OUTPUT: I/O last 3 completed shifts: In: 13842.1 [I.V.:12337.1; IV Piggyback:1505] Out: 7341 [PFXTK:2409; Stool:400]  PHYSICAL EXAMINATION:  General , sleepy  HEENT No gross abnormalities.   Pulmonary Clear to auscultation bilaterally with no wheezes, rales or ronchi. Good effort, symmetrical expansion.   Cardiovascular Normal rate, regular rhythm. S1, s2.  No m/r/g. Distal pulses palpable.  Abdomen Soft, non-tender, non-distended, positive bowel sounds, no palpable organomegaly or masses. Normoresonant to percussion.  Musculoskeletal Congenital abnormalities of fingers and toes; otherwise grossly normal.  Lymphatics No cervical, supraclavicular or axillary adenopathy.   Neurologic Grossly intact. No focal deficits. SLEEPY   Skin/Integuement No rash, no cyanosis, no clubbing. No edema     LABS: PULMONARY No results for input(s): PHART, PCO2ART, PO2ART, HCO3, TCO2, O2SAT in the last 168 hours.  Invalid input(s): PCO2, PO2  CBC  Recent Labs Lab 01/02/16 1536 01/03/16 0320 01/04/16 0337  HGB 15.9* 12.6 13.5  HCT 46.8* 36.9 39.6  WBC 11.1* 11.0* 11.7*  PLT 346 341 276    COAGULATION No results for input(s): INR in the last 168 hours.  CARDIAC    Recent Labs Lab 01/04/16 0337  TROPONINI <0.03   No results for input(s): PROBNP in the last 168 hours.   CHEMISTRY  Recent Labs Lab 01/02/16 1536 01/02/16 2030 01/03/16 0320 01/04/16 0337  NA 143 140 140 140  K 4.3 2.5* 2.4* 3.2*  CL 111 117* 115* 114*  CO2 23 20* 21* 19*  GLUCOSE 102* 236* 121* 149*  BUN 10 10 6  <5*  CREATININE 1.09* 1.10* 0.95 0.91  CALCIUM 9.1 7.8* 7.8* 7.7*  MG  --   --  1.4* 1.7  PHOS  --   --  <1.0* 2.4*   CrCl cannot be calculated (Unknown ideal weight.).  LIVER  Recent Labs Lab 01/02/16 1536 01/03/16 0320  AST 28  --   ALT 28  --   ALKPHOS 84  --   BILITOT 1.5*  --   PROT 6.8  --   ALBUMIN 4.6 2.9*     INFECTIOUS  Recent Labs Lab 01/02/16 1554 01/04/16 0337  LATICACIDVEN 2.04* 2.8*     ENDOCRINE CBG (last 3)   Recent Labs  01/04/16 0705 01/04/16 0804 01/04/16 0903  GLUCAP 135* 124* 112*         IMAGING x48h  - image(s) personally visualized  -   highlighted in bold Dg Chest 1 View  Result Date: 01/02/2016 CLINICAL DATA:  Unresponsive.  Possible overdose. EXAM: CHEST 1 VIEW COMPARISON:  05/01/2009  FINDINGS: Artifact overlies chest. Heart size is normal. Mediastinal shadows are normal. The lungs are clear. No effusions. Chronic spinal curvature. IMPRESSION: No active disease. Electronically Signed   By: Paulina FusiMark  Shogry M.D.   On: 01/02/2016 16:35   Ct Head Wo Contrast  Result Date: 01/02/2016 CLINICAL DATA:  Altered mental status EXAM: CT HEAD WITHOUT CONTRAST TECHNIQUE: Contiguous axial images were obtained from the base of the skull through the vertex without intravenous contrast. COMPARISON:  04/27/2009 FINDINGS: Brain: No evidence of acute or remote infarction, hemorrhage, hydrocephalus, extra-axial collection or mass lesion/mass effect. Vascular: No hyperdense vessel or unexpected calcification. Skull: No acute or aggressive finding. Sinuses/Orbits: Mild mucosal edema in the ethmoid sinuses. IMPRESSION: No acute intracranial finding or change from 2011. Electronically Signed   By: Marnee SpringJonathon  Watts M.D.   On: 01/02/2016 16:40       DISCUSSION: Dana Wolfe is a [age]F with history of bipolar disorder, Crohn's, fibromyalgia who presents after intentional beta blocker overdose with suicidal intentions. In the ED she became bradycardic with some hemodynamic instability and is admitted to ICU for monitoring of high dose insulin therapy.   ASSESSMENT / PLAN:  CARDIOVASCULAR A:  Bradycardia with circulatory shock 2/2 beta blocker toxicity - propranalol - short half life 4h    - all improving 01/04/2016.  Off epi gtt. Off insulin gtt. Still on d10 but sugars ok. HR 70  P:  EKG x 1 now Slowly wean d10 off   PSYCHIATRIC A:  Active suicidal ideation/intent P: Suicide precautions Sitter Hold [facility]  PULMONARY A: No acute issues P:   Monitor  RENAL A:   Hypokalemia Hypomagnesemia Hypophosphatemia   - all repleted 01/03/2016 but still mildly defiiicient 11/19  P:   repllete K phos and mag Trend BMP  GASTROINTESTINAL A:   No  acute issues Hx Crohns P:   Can eat - regular diet  HEMATOLOGIC A:   No acute issues; CBC appears hemoconcentrated P:  Fluid resuscitate Trend CBC No infectious symptoms; defer Abx  INFECTIOUS A:   No acute infection P:   monitor  ENDOCRINE A:   Glycemic control   P:   Maintain euglycemia while on D10 Slowly wean to off  NEUROLOGIC A:   Mild lethargy but this is likely due to depression/sleepinness but oriented 01/04/2016  P:   RASS goal: 0   PSYCH A OD  Intentional P Sitter Psych [facility]  GLOBAL Move to tele 01/04/2016 and PCCM off with [facility] pick up 1120/17 - d/w Dr Thalia PartyAid  FAMILY  - Updates: No family available. Patient updated. Patient with limited decision-making capacity given active suicidal ideation; will remain FULL CODE     Dr.  Kalman Shan, M.D., F.C.C.P Pulmonary and Critical Care Medicine Staff Physician [facility] [place] [facility] Pager: [phone], If no answer or between  15:00h - 7:00h: call [phone]  01/04/2016 9:20 AM

## 2016-01-04 NOTE — Progress Notes (Signed)
Upon RN assessment pt extremely combative. Pt fighting sitter, kicking, screaming, and trying to climb out of bed. Called Elink MD and received order for 4 point soft restraints. MD ordered PRN Haldol for agitation. QTc 0.46. Within 5 minutes after giving haldol questionable apneic period with gasps followed by shallow breathing. Bagging was not necessary. Pt recovered quickly with spO2 100% on room air. Haldol d/c'd. Pt currently resting with normal resperations. Will continue to monitor pt closely.

## 2016-01-04 NOTE — Progress Notes (Signed)
eLink Physician-Brief Progress Note Patient Name: Dana MusaKaren Wolfe DOB: Jun 26, 1950 MRN: [id number]   Date of Service  01/04/2016  HPI/Events of Note  Notified by bedside nurse of lactic acid 2.8. Troponin I negative. Magnesium 1.7 & potassium 3.2. Creatinine 0.91. Remains on epinephrine infusion.   eICU Interventions  1. KCl 30 mEq IV 1 2. Magnesium sulfate 1 g IV      Intervention Category Intermediate Interventions: Electrolyte abnormality - evaluation and management  Lawanda CousinsJennings Caitlynne Harbeck 01/04/2016, 5:02 AM

## 2016-01-04 NOTE — Progress Notes (Signed)
CRITICAL VALUE ALERT  Critical value received:  Lactic Acid 2.8  Date of notification:  01/04/16  Time of notification:  0420  Critical value read back: yes  Nurse who received alert:  Ezequiel Kayser, RN  MD notified (1st page):  Dr. Jamison Neighbor  Time of first page:  [phone]

## 2016-01-04 NOTE — Progress Notes (Signed)
Chaplain visit the result of a Spiritual Consult placed in Dana Marner's chart.  In consultation with staff in the ICU and [place] [facility], it was decided that Dana Wolfe's emotional state is too unstable for a visit at this time. According to staff, she refuses all conversation and, at present has one goal in her life, and that is to end it as soon as possible. She is very angry that she was found before she died. She vows to try again. It is also reported that her overdose of Propranolol was intentional.  Based on consultation with staff the Spiritual Care Consult remains active. Staff understands that chaplains responded in the proper time, and considers a chaplain visit after another 24 hours may be of greater benefit than an emotional upheaval at this time.  Benjie Karvonen. Maitland Lesiak, DMin Chaplain

## 2016-01-04 NOTE — Progress Notes (Signed)
eLink Physician-Brief Progress Note Patient Name: Dana Wolfe DOB: 1950-06-13 MRN: [id number]   Date of Service  01/04/2016  HPI/Events of Note  Multiple issues: 1. Lactic Acid = 2.8 >> 3.0 and 2. Blood glucose = 140 - Now off of Insulin IV infusion and remains on D10W IV infusion.   eICU Interventions  Will order: 1. 0.9 NaCl 1 liter IV over 1 hour now.  2. Change IVF to D5 0.9 NaCl to run at 75 mL/hour.      Intervention Category Major Interventions: Acid-Base disturbance - evaluation and management Intermediate Interventions: Hyperglycemia - evaluation and treatment  Sommer,Steven Eugene 01/04/2016, 4:56 PM

## 2016-01-05 DIAGNOSIS — K50919 Crohn's disease, unspecified, with unspecified complications: Secondary | ICD-10-CM

## 2016-01-05 DIAGNOSIS — F314 Bipolar disorder, current episode depressed, severe, without psychotic features: Secondary | ICD-10-CM

## 2016-01-05 LAB — CBC WITH DIFFERENTIAL/PLATELET
Basophils Absolute: 0.1 10*3/uL (ref 0.0–0.1)
Basophils Relative: 1 %
EOS PCT: 2 %
Eosinophils Absolute: 0.2 10*3/uL (ref 0.0–0.7)
HCT: 40.1 % (ref 36.0–46.0)
Hemoglobin: 13.3 g/dL (ref 12.0–15.0)
LYMPHS ABS: 2.1 10*3/uL (ref 0.7–4.0)
LYMPHS PCT: 24 %
MCH: 28.5 pg (ref 26.0–34.0)
MCHC: 33.2 g/dL (ref 30.0–36.0)
MCV: 86.1 fL (ref 78.0–100.0)
MONO ABS: 0.6 10*3/uL (ref 0.1–1.0)
MONOS PCT: 7 %
NEUTROS ABS: 5.8 10*3/uL (ref 1.7–7.7)
Neutrophils Relative %: 66 %
Platelets: 251 10*3/uL (ref 150–400)
RBC: 4.66 MIL/uL (ref 3.87–5.11)
RDW: 13.8 % (ref 11.5–15.5)
WBC: 8.8 10*3/uL (ref 4.0–10.5)

## 2016-01-05 LAB — BASIC METABOLIC PANEL
Anion gap: 6 (ref 5–15)
BUN: <5 mg/dL — ABNORMAL LOW (ref 6–20)
CALCIUM: 8 mg/dL — AB (ref 8.9–10.3)
CO2: 21 mmol/L — AB (ref 22–32)
CREATININE: 0.82 mg/dL (ref 0.44–1.00)
Chloride: 116 mmol/L — ABNORMAL HIGH (ref 101–111)
GFR calc non Af Amer: >60 mL/min (ref >60)
Glucose, Bld: 106 mg/dL — ABNORMAL HIGH (ref 65–99)
Potassium: 3.9 mmol/L (ref 3.5–5.1)
Sodium: 143 mmol/L (ref 135–145)

## 2016-01-05 LAB — GLUCOSE, CAPILLARY
GLUCOSE-CAPILLARY: 98 mg/dL (ref 65–99)
GLUCOSE-CAPILLARY: 98 mg/dL (ref 65–99)
GLUCOSE-CAPILLARY: 99 mg/dL (ref 65–99)
GLUCOSE-CAPILLARY: 118 mg/dL — AB (ref 65–99)
GLUCOSE-CAPILLARY: 126 mg/dL — AB (ref 65–99)
Glucose-Capillary: 101 mg/dL — ABNORMAL HIGH (ref 65–99)
Glucose-Capillary: 102 mg/dL — ABNORMAL HIGH (ref 65–99)
Glucose-Capillary: 85 mg/dL (ref 65–99)
Glucose-Capillary: 88 mg/dL (ref 65–99)
Glucose-Capillary: 88 mg/dL (ref 65–99)
Glucose-Capillary: 90 mg/dL (ref 65–99)
Glucose-Capillary: 94 mg/dL (ref 65–99)
Glucose-Capillary: 99 mg/dL (ref 65–99)

## 2016-01-05 LAB — MAGNESIUM: MAGNESIUM: 2.1 mg/dL (ref 1.7–2.4)

## 2016-01-05 LAB — LACTIC ACID, PLASMA
LACTIC ACID, VENOUS: 2.6 mmol/L — AB (ref 0.5–1.9)
LACTIC ACID, VENOUS: 2.4 mmol/L — AB (ref 0.5–1.9)
Lactic Acid, Venous: 1.7 mmol/L (ref 0.5–1.9)

## 2016-01-05 LAB — PHOSPHORUS: PHOSPHORUS: 2.8 mg/dL (ref 2.5–4.6)

## 2016-01-05 MED ORDER — HALOPERIDOL LACTATE 5 MG/ML IJ SOLN
2.5000 mg | Freq: Once | INTRAMUSCULAR | Status: DC
Start: 2016-01-05 — End: 2016-01-07

## 2016-01-05 MED ORDER — SODIUM CHLORIDE 0.9 % IV BOLUS (SEPSIS)
500.0000 mL | Freq: Once | INTRAVENOUS | Status: AC
  Administered 2016-01-05: 500 mL via INTRAVENOUS

## 2016-01-05 MED ORDER — ENSURE ENLIVE PO LIQD: 237.0000 mL | Freq: Two times a day (BID) | ORAL | Status: DC | PRN

## 2016-01-05 MED ORDER — SODIUM CHLORIDE 0.9 % IV BOLUS (SEPSIS)
1000.0000 mL | Freq: Once | INTRAVENOUS | Status: AC
  Administered 2016-01-05: 1000 mL via INTRAVENOUS

## 2016-01-05 NOTE — Progress Notes (Signed)
Assumed care of patient from previous RN.  Agree with previous RN's assessment of patient.  Will proceed with saline bolus ordered by Dr. Jarvis Newcomer and will continue to monitor.

## 2016-01-05 NOTE — Progress Notes (Signed)
Patient very belligerent and found cussing at staff while doing beside rounding. Patient yelling that no one fed her or gave her anything to drink today. Offered patient food and drink at that time which she declined. Patient very upset and agitated; threw her tele box.  Patient also refusing to allow staff to take her q4h CBG checks. NP on call notified, will continue to monitor patient.

## 2016-01-05 NOTE — Progress Notes (Signed)
PROGRESS NOTE  [name]  BEE:[id number] DOB: 07-19-1950 DOA: 01/02/2016 PCP: No PCP per pt.  Brief Narrative: Dana Wolfe is a 65 y.o. female with a history of Crohn's, bipolar disorder, fibromyalgia, and congenital limb deformities who was brought by EMS after a friend found her in her unresponsive at home after taking an intentional overdose of propranolol. In the ED she became bradycardic and hypotensive. IV fluid boluses were given, high dose insulin and D10 were administered. Epinephrine drip was started with improvement in pressures. This was weaned to off 11/19. She has continued to endorse suicidal ideation throughout this hospitalization.  Assessment & Plan: Principal Problem:   Suicide attempt by beta blocker overdose (HCC) Active Problems:   Encephalopathy acute   Bipolar 1 disorder, depressed, severe (HCC)  Lactate elevation: No evidence of sepsis with normal WBC, afebrile.  Suspect hypotensive hypoperfusion and being cleared as it is trending downward.  - Repeat bolus now, echo 11/18 showed normal LV function. - Recheck in 8 hours. - No abx at this time, draw blood cultures if becomes febrile.  Active suicidal ideation:  - Suicide precautions including sitter - Psych hold and dispo to psych facility when medically stable.  - Holding primidone, mirapex, and neurontin report home medications.  Electrolyte derangements: Hypokalemia, hypomagnesemia, and hypophosphatemia have been monitored and repleted as necessary. Largely related to cellular shifts with insulin and beta blockade.  - Continue to monitor  - Recheck in AM  Hypoglycemia: Related to large insulin doses in ICU. - Continue dextrose-containing fluids and monitor.  - Space monitoring to q4h  Crohn's disease: In apparent remission per pt, no records for review.  - No medications at home - Monitor  DVT prophylaxis: SCDs Code Status: Full Family Communication: None at bedside, does not know numbers to  call.  Disposition Plan: Continued observation. Will be medically stable for discharge 11/21.   Consultants:   Psychiatry  Procedures:   Echocardiogram  Antimicrobials:  None   Subjective: Pt still actively suicidal. Won't talk to me. Denies pain or fever.  Objective: Vitals:   01/04/16 1346 01/04/16 2105 01/05/16 0527 01/05/16 1401  BP: (!) 118/57 (!) 93/50 113/61 133/60  Pulse: 78 79 64 78  Resp: 14 20 20    Temp: 98.1 F (36.7 C) 98.8 F (37.1 C) 98.6 F (37 C)   TempSrc: Oral Axillary Axillary   SpO2: 97% 95% 100%   Weight:        Intake/Output Summary (Last 24 hours) at 01/05/16 1557 Last data filed at 01/05/16 1500  Gross per 24 hour  Intake           2437.5 ml  Output                0 ml  Net           2437.5 ml   Filed Weights   01/02/16 1715 01/03/16 0215  Weight: 61.2 kg (135 lb) 62.5 kg (137 lb 12.6 oz)    Examination: General exam: 65 y.o. female in no distress  Respiratory system: Non-labored breathing room air. Clear to auscultation bilaterally.  Cardiovascular system: Regular rate and rhythm. No murmur, rub, or gallop. No JVD, and no pedal edema. Gastrointestinal system: Abdomen soft, non-tender, non-distended, with normoactive bowel sounds. No organomegaly or masses felt. Central nervous system: Alert and oriented. No focal neurological deficits. Gait wnl. Extremities: Warm, congenital hand and foot deformities without wounds. Skin: No rashes, lesions no ulcers Psychiatry: Depressed mood and severely restricted affect, withdrawn.  Data Reviewed: I have personally reviewed following labs and imaging studies  CBC:  Recent Labs Lab 01/02/16 1536 01/03/16 0320 01/04/16 0337 01/05/16 0432  WBC 11.1* 11.0* 11.7* 8.8  NEUTROABS 7.2 7.5 8.3* 5.8  HGB 15.9* 12.6 13.5 13.3  HCT 46.8* 36.9 39.6 40.1  MCV 84.8 84.4 83.5 86.1  PLT 346 341 276 251   Basic Metabolic Panel:  Recent Labs Lab 01/02/16 1536 01/02/16 2030 01/03/16 0320  01/04/16 0337 01/05/16 0432  NA 143 140 140 140 143  K 4.3 2.5* 2.4* 3.2* 3.9  CL 111 117* 115* 114* 116*  CO2 23 20* 21* 19* 21*  GLUCOSE 102* 236* 121* 149* 106*  BUN 10 10 6  <5* <5*  CREATININE 1.09* 1.10* 0.95 0.91 0.82  CALCIUM 9.1 7.8* 7.8* 7.7* 8.0*  MG  --   --  1.4* 1.7 2.1  PHOS  --   --  <1.0* 2.4* 2.8   GFR: CrCl cannot be calculated (Unknown ideal weight.). Liver Function Tests:  Recent Labs Lab 01/02/16 1536 01/03/16 0320  AST 28  --   ALT 28  --   ALKPHOS 84  --   BILITOT 1.5*  --   PROT 6.8  --   ALBUMIN 4.6 2.9*   No results for input(s): LIPASE, AMYLASE in the last 168 hours.  Recent Labs Lab 01/02/16 1536  AMMONIA 32   Coagulation Profile: No results for input(s): INR, PROTIME in the last 168 hours. Cardiac Enzymes:  Recent Labs Lab 01/04/16 0337  TROPONINI <0.03   BNP (last 3 results) No results for input(s): PROBNP in the last 8760 hours. HbA1C: No results for input(s): HGBA1C in the last 72 hours. CBG:  Recent Labs Lab 01/05/16 0621 01/05/16 0721 01/05/16 0827 01/05/16 0924 01/05/16 1146  GLUCAP 94 90 85 99 99   Lipid Profile: No results for input(s): CHOL, HDL, LDLCALC, TRIG, CHOLHDL, LDLDIRECT in the last 72 hours. Thyroid Function Tests: No results for input(s): TSH, T4TOTAL, FREET4, T3FREE, THYROIDAB in the last 72 hours. Anemia Panel: No results for input(s): VITAMINB12, FOLATE, FERRITIN, TIBC, IRON, RETICCTPCT in the last 72 hours. Urine analysis:    Component Value Date/Time   COLORURINE YELLOW 01/02/2016 2007   APPEARANCEUR CLOUDY (A) 01/02/2016 2007   LABSPEC 1.017 01/02/2016 2007   PHURINE 6.0 01/02/2016 2007   GLUCOSEU >1000 (A) 01/02/2016 2007   HGBUR NEGATIVE 01/02/2016 2007   BILIRUBINUR NEGATIVE 01/02/2016 2007   KETONESUR NEGATIVE 01/02/2016 2007   PROTEINUR NEGATIVE 01/02/2016 2007   UROBILINOGEN 0.2 04/30/2009 1511   NITRITE NEGATIVE 01/02/2016 2007   LEUKOCYTESUR SMALL (A) 01/02/2016 2007    Sepsis Labs: @LABRCNTIP (procalcitonin:4,lacticidven:4)  ) Recent Results (from the past 240 hour(s))  MRSA PCR Screening     Status: None   Collection Time: 01/02/16  9:55 PM  Result Value Ref Range Status   MRSA by PCR NEGATIVE NEGATIVE Final    Comment:        The GeneXpert MRSA Assay (FDA approved for NASAL specimens only), is one component of a comprehensive MRSA colonization surveillance program. It is not intended to diagnose MRSA infection nor to guide or monitor treatment for MRSA infections.      Radiology Studies: No results found.  Scheduled Meds: . sodium chloride  1,000 mL Intravenous Once   Continuous Infusions: . dextrose 5 % and 0.9% NaCl 75 mL/hr at 01/04/16 1950     LOS: 2 days   Time spent: 25 minutes.  Hazeline Junkeryan Grunz, MD [facility] Pager [phone]  If 7PM-7AM, please contact night-coverage [email or web address] Password [facility] 01/05/2016, 3:57 PM

## 2016-01-05 NOTE — Progress Notes (Signed)
CRITICAL VALUE ALERT  Critical value received: Lactic 2.3  Date of notification:  01/05/16  Time of notification:    Critical value read back:Yes.    Nurse who received alert:  Lorretta Harp Shakelia Scrivner, RN  MD notified (1st page):  K Schoor  Time of first page:  06:00  MD notified (2nd page):  Time of second page:  Responding MD:  Molli BarrowsK Schoor, NP  Time MD responded:  06:15

## 2016-01-05 NOTE — Progress Notes (Signed)
Patient refused talk or answer any questions presented by [facility] for assessment.  LCSWA will assist with patient disposition to inpatient psych. when medically stable.    Vivi BarrackNicole Donaciano Range, Theresia MajorsLCSWA, MSW Clinical Social Worker 5E and Psychiatric Service Line [phone] 01/05/2016  3:20 PM

## 2016-01-05 NOTE — Progress Notes (Signed)
Initial Nutrition Assessment  DOCUMENTATION CODES:   Not applicable  INTERVENTION:  Will order Ensure Enlive po BID PRN, each supplement provides 350 kcal and 20 grams of protein.  NUTRITION DIAGNOSIS:   Inadequate oral intake related to acute illness, other (see comment) (suicidal ideations, patient refusing) as evidenced by meal completion < 25%, other (see comment) (per RN and Sitter reports).  GOAL:   Patient will meet greater than or equal to 90% of their needs  MONITOR:   PO intake, Supplement acceptance, Labs, Weight trends, I & O's  REASON FOR ASSESSMENT:   Malnutrition Screening Tool    ASSESSMENT:   65 year old female with history of Crohn's disease, bipolar disorder, fibromyalgia, and congential deformities (digits of hands/feet), who presents via EMS after being found down by a friend with a bottle of propranolol 120mg  beside her. Patient endorsed suicidal ideation and that this was an intentional overdose.    -Psychiatrist recommending psychiatric inpatient admission when medically cleared.  Attempted to speak with patient at bedside. She was sitting on the edge of her bed looking at the floor. Would not respond to RD. Sitter reports patient is not responding to providers and is not eating. Reports the dinner last night was ordered by sitter but patient did not want it as she is still having suicidal ideations and does not want to eat. RN reports patient has been combative when asked about her height, so unable to obtain.  Per MD progress note yesterday, patient was denying N/V. She has had low potassium and phosphorus requiring repletion.  There is no weight history on file to compare weight and patient unable to report weight history at this time. Will not be able to calculate estimated calorie and protein needs without height.  Meal Completion: 0%  Medications reviewed and include: D5-NS @ 75 ml/hr (90 grams dextrose, 306 kcal daily), Haldol 1 mg Q3hrs PRN for  agitated delerium.  Labs reviewed: CBG 85-99, Chloride 116, CO2 21, BUN <5; Potassium, Phosphorus, and Magnesium WNL today.  Could not complete Nutrition-Focused Physical Exam. From observation, no obvious signs of wasting noted.  Diet Order:  Diet regular Room service appropriate? Yes; Fluid consistency: Thin  Skin:  Reviewed, no issues  Last BM:  01/04/2016  Height:   Ht Readings from Last 1 Encounters:  No data found for Ht    Weight:   Wt Readings from Last 1 Encounters:  01/03/16 137 lb 12.6 oz (62.5 kg)    Ideal Body Weight:    Unable to determine without height.  BMI:  There is no height or weight on file to calculate BMI.  Estimated Nutritional Needs:  Unable to calculate without height.  Kcal:     Protein:     Fluid:     EDUCATION NEEDS:   Education needs no appropriate at this time  Helane Rima, MS, RD, LDN Pager: [phone] After Hours Pager: [phone]

## 2016-01-05 NOTE — Progress Notes (Signed)
CRITICAL VALUE ALERT  Critical value received:  Lactic Acid 2.4  Date of notification:  01/05/2016   Time of notification:  1500  Critical value read back:Yes.    Nurse who received alert:  Dolores Hoose   MD notified (1st page):  Dr. Jarvis Newcomer  Time of first page:  1502  MD notified (2nd page):  Time of second page:  Responding MD:    Time MD responded:    Lactic acid 2.4 trending down. MD made aware

## 2016-01-05 NOTE — Progress Notes (Signed)
Patient sleeping most of the night, refused to answer any of RN's questions regarding suicidal ideation or plan. Found tearful at times.  Will continue to monitor patient.

## 2016-01-06 LAB — CBC WITH DIFFERENTIAL/PLATELET
BASOS ABS: 0 10*3/uL (ref 0.0–0.1)
BASOS PCT: 0 %
EOS ABS: 0.2 10*3/uL (ref 0.0–0.7)
EOS PCT: 2 %
HEMATOCRIT: 37.2 % (ref 36.0–46.0)
Hemoglobin: 12.6 g/dL (ref 12.0–15.0)
Lymphocytes Relative: 32 %
Lymphs Abs: 2.3 10*3/uL (ref 0.7–4.0)
MCH: 28.6 pg (ref 26.0–34.0)
MCHC: 33.9 g/dL (ref 30.0–36.0)
MCV: 84.5 fL (ref 78.0–100.0)
MONO ABS: 0.7 10*3/uL (ref 0.1–1.0)
MONOS PCT: 9 %
Neutro Abs: 4.1 10*3/uL (ref 1.7–7.7)
Neutrophils Relative %: 57 %
PLATELETS: 217 10*3/uL (ref 150–400)
RBC: 4.4 MIL/uL (ref 3.87–5.11)
RDW: 13.3 % (ref 11.5–15.5)
WBC: 7.2 10*3/uL (ref 4.0–10.5)

## 2016-01-06 LAB — PHOSPHORUS: PHOSPHORUS: 2.7 mg/dL (ref 2.5–4.6)

## 2016-01-06 LAB — MAGNESIUM: Magnesium: 1.9 mg/dL (ref 1.7–2.4)

## 2016-01-06 LAB — LACTIC ACID, PLASMA
LACTIC ACID, VENOUS: 2 mmol/L — AB (ref 0.5–1.9)
Lactic Acid, Venous: 1.4 mmol/L (ref 0.5–1.9)

## 2016-01-06 MED ORDER — SODIUM CHLORIDE 0.9 % IV BOLUS (SEPSIS)
500.0000 mL | Freq: Once | INTRAVENOUS | Status: AC
  Administered 2016-01-06: 500 mL via INTRAVENOUS

## 2016-01-06 MED ORDER — LOPERAMIDE HCL 2 MG PO CAPS
4.0000 mg | ORAL_CAPSULE | Freq: Once | ORAL | Status: AC
  Administered 2016-01-06: 4 mg via ORAL
  Filled 2016-01-06: qty 2

## 2016-01-06 NOTE — Progress Notes (Signed)
CRITICAL VALUE ALERT  Critical value received:  Lactic Acid 2.0  Date of notification:  01/06/16  Time of notification:  0400  Critical value read back:Yes.    Nurse who received alert:  Lorretta Harp, RN  MD notified (1st page):  K Schoor  Time of first page:  0405  MD notified (2nd page):  Time of second page:  Responding MD:  Molli Barrows  Time MD responded:

## 2016-01-06 NOTE — Progress Notes (Signed)
IVC paperwork completed with Dr. Jarvis Newcomer. IVC faxed to Magistrate. Magistrate confirmed received.

## 2016-01-06 NOTE — Progress Notes (Signed)
Clinical information faxed to patient psych facilities. Will inform medical staff when bed available.   Vivi Barrack, Theresia Majors, MSW Clinical Social Worker 5E and Psychiatric Service Line [phone] 01/06/2016  12:33 PM

## 2016-01-06 NOTE — Progress Notes (Signed)
PROGRESS NOTE  Dana Wolfe  ZOX:[id number] DOB: 06/13/1950 DOA: 01/02/2016 PCP: No PCP per pt.  Brief Narrative: Dana Wolfe is a 65 y.o. female with a history of Crohn's, bipolar disorder, fibromyalgia, and congenital limb deformities who was brought by EMS after a friend found her in her unresponsive at home after taking an intentional overdose of propranolol. In the ED she became bradycardic and hypotensive. IV fluid boluses were given, high dose insulin and D10 were administered. Epinephrine drip was started with improvement in pressures. This was weaned to off 11/19. She has continued to endorse suicidal ideation throughout this hospitalization.  Assessment & Plan: Principal Problem:   Suicide attempt by beta blocker overdose (HCC) Active Problems:   Encephalopathy acute   Bipolar 1 disorder, depressed, severe (HCC)  Active suicidal ideation: Ongoing. - Suicide precautions including sitter - Psych hold and dispo to psych facility when bed available. She is medically stable.   - Holding medications per psychiatry recommendation on 11/18  Lactate elevation: Resolved. No evidence of sepsis with normal WBC, afebrile. Suspect hypotensive hypoperfusion and being cleared as it is trending downward.  - No abx at this time, draw blood cultures if becomes febrile.  Electrolyte derangements: Hypokalemia, hypomagnesemia, and hypophosphatemia have been monitored and repleted as necessary. Largely related to cellular shifts with insulin and beta blockade.  - Resolved. Recheck intermittently.  Hypoglycemia: Related to large insulin doses in ICU. Resolved. - Continue dextrose-containing fluids, CBG qAM.  Crohn's disease: In apparent remission per pt, no records for review.  - No medications at home - Monitor  DVT prophylaxis: SCDs Code Status: Full Family Communication: None at bedside, does not know numbers to call.  Disposition Plan: Continued observation. Will be medically stable  for discharge 11/21.   Consultants:   Psychiatry  Procedures:   Echocardiogram 01/03/2016:  Study Conclusions - Procedure narrative: Transthoracic echocardiography. Image   quality was adequate. The study was technically difficult. - Left ventricle: The cavity size was normal. Wall thickness was   normal. Systolic function was normal. The estimated ejection   fraction was in the range of 60% to 65%. Wall motion was normal;   there were no regional wall motion abnormalities. Left   ventricular diastolic function parameters were normal. - Left atrium: The atrium was normal in size. - Right ventricle: The cavity size was normal. Wall thickness was   normal. Systolic function was normal. - Right atrium: The atrium was at the upper limits of normal in   size. - Pulmonary arteries: PA peak pressure: 34 mm Hg (S). - Systemic veins: The IVC measures <2.1 cm, but does not collapse   >50%, suggesting an elevated RA pressure of 8 mmHg.  Impressions: - Technically difficult study. LVEF 60-65%, normal wall thickness,   normal wall motion, normal diastolic function, upper normal RA   size, mild TR with RVSP 34 mmHg, IVC suggests RA pressure of 8   mmHg.  Antimicrobials:  None   Subjective: Pt still actively suicidal. Won't talk to me. Denies pain or fever. Not eating at all.  Objective: Vitals:   01/05/16 2020 01/06/16 0317 01/06/16 0636 01/06/16 1318  BP: (!) 126/53 (!) 141/71 129/77 (!) 131/97  Pulse: 68 68 72 74  Resp: 18 16 17 16   Temp: 98 F (36.7 C) 97.7 F (36.5 C) 97.7 F (36.5 C) 98.4 F (36.9 C)  TempSrc: Axillary Oral Oral Oral  SpO2: 99% 100% 98% 97%  Weight:    61.3 kg (135 lb 1.6 oz)  Intake/Output Summary (Last 24 hours) at 01/06/16 1501 Last data filed at 01/06/16 0700  Gross per 24 hour  Intake             1200 ml  Output                0 ml  Net             1200 ml   Filed Weights   01/02/16 1715 01/03/16 0215 01/06/16 1318  Weight: 61.2 kg (135  lb) 62.5 kg (137 lb 12.6 oz) 61.3 kg (135 lb 1.6 oz)    Examination: General exam: 65 y.o. female in no distress  Respiratory system: Non-labored breathing room air. Clear to auscultation bilaterally.  Cardiovascular system: Regular rate and rhythm. No murmur, rub, or gallop. No JVD, and no pedal edema. Gastrointestinal system: Abdomen soft, non-tender, non-distended, with normoactive bowel sounds. No organomegaly or masses felt. Central nervous system: Alert and oriented. No focal neurological deficits. Not cooperative with exam. Extremities: Warm, congenital hand and foot deformities without wounds. Skin: No rashes, lesions no ulcers Psychiatry: Depressed mood and severely restricted affect, withdrawn.   Data Reviewed: I have personally reviewed following labs and imaging studies  CBC:  Recent Labs Lab 01/02/16 1536 01/03/16 0320 01/04/16 0337 01/05/16 0432 01/06/16 0203  WBC 11.1* 11.0* 11.7* 8.8 7.2  NEUTROABS 7.2 7.5 8.3* 5.8 4.1  HGB 15.9* 12.6 13.5 13.3 12.6  HCT 46.8* 36.9 39.6 40.1 37.2  MCV 84.8 84.4 83.5 86.1 84.5  PLT 346 341 276 251 217   Basic Metabolic Panel:  Recent Labs Lab 01/02/16 1536 01/02/16 2030 01/03/16 0320 01/04/16 0337 01/05/16 0432 01/06/16 0203  NA 143 140 140 140 143  --   K 4.3 2.5* 2.4* 3.2* 3.9  --   CL 111 117* 115* 114* 116*  --   CO2 23 20* 21* 19* 21*  --   GLUCOSE 102* 236* 121* 149* 106*  --   BUN 10 10 6  <5* <5*  --   CREATININE 1.09* 1.10* 0.95 0.91 0.82  --   CALCIUM 9.1 7.8* 7.8* 7.7* 8.0*  --   MG  --   --  1.4* 1.7 2.1 1.9  PHOS  --   --  <1.0* 2.4* 2.8 2.7   GFR: CrCl cannot be calculated (Unknown ideal weight.). Liver Function Tests:  Recent Labs Lab 01/02/16 1536 01/03/16 0320  AST 28  --   ALT 28  --   ALKPHOS 84  --   BILITOT 1.5*  --   PROT 6.8  --   ALBUMIN 4.6 2.9*   No results for input(s): LIPASE, AMYLASE in the last 168 hours.  Recent Labs Lab 01/02/16 1536  AMMONIA 32   Coagulation  Profile: No results for input(s): INR, PROTIME in the last 168 hours. Cardiac Enzymes:  Recent Labs Lab 01/04/16 0337  TROPONINI <0.03   BNP (last 3 results) No results for input(s): PROBNP in the last 8760 hours. HbA1C: No results for input(s): HGBA1C in the last 72 hours. CBG:  Recent Labs Lab 01/05/16 0827 01/05/16 0924 01/05/16 1146 01/05/16 1512 01/05/16 1723  GLUCAP 85 99 99 118* 126*   Lipid Profile: No results for input(s): CHOL, HDL, LDLCALC, TRIG, CHOLHDL, LDLDIRECT in the last 72 hours. Thyroid Function Tests: No results for input(s): TSH, T4TOTAL, FREET4, T3FREE, THYROIDAB in the last 72 hours. Anemia Panel: No results for input(s): VITAMINB12, FOLATE, FERRITIN, TIBC, IRON, RETICCTPCT in the last 72 hours. Urine analysis:  Component Value Date/Time   COLORURINE YELLOW 01/02/2016 2007   APPEARANCEUR CLOUDY (A) 01/02/2016 2007   LABSPEC 1.017 01/02/2016 2007   PHURINE 6.0 01/02/2016 2007   GLUCOSEU >1000 (A) 01/02/2016 2007   HGBUR NEGATIVE 01/02/2016 2007   BILIRUBINUR NEGATIVE 01/02/2016 2007   KETONESUR NEGATIVE 01/02/2016 2007   PROTEINUR NEGATIVE 01/02/2016 2007   UROBILINOGEN 0.2 04/30/2009 1511   NITRITE NEGATIVE 01/02/2016 2007   LEUKOCYTESUR SMALL (A) 01/02/2016 2007   Sepsis Labs: @LABRCNTIP (procalcitonin:4,lacticidven:4)  ) Recent Results (from the past 240 hour(s))  MRSA PCR Screening     Status: None   Collection Time: 01/02/16  9:55 PM  Result Value Ref Range Status   MRSA by PCR NEGATIVE NEGATIVE Final    Comment:        The GeneXpert MRSA Assay (FDA approved for NASAL specimens only), is one component of a comprehensive MRSA colonization surveillance program. It is not intended to diagnose MRSA infection nor to guide or monitor treatment for MRSA infections.      Radiology Studies: No results found.  Scheduled Meds: . haloperidol lactate  2.5 mg Intravenous Once   Continuous Infusions: . dextrose 5 % and 0.9% NaCl  75 mL/hr at 01/06/16 0818     LOS: 3 days   Time spent: 15 minutes.  Hazeline Junker, MD [facility] Pager [phone]  If 7PM-7AM, please contact night-coverage [email or web address] Password [email or web address] 01/06/2016, 3:01 PM

## 2016-01-06 NOTE — Progress Notes (Addendum)
I took patient's weight with standing scale and when her weight came up she stated, "How do I weigh that much? I haven't eaten in two weeks. I was expecting better news." She seem agitated.

## 2016-01-07 DIAGNOSIS — T447X2S Poisoning by beta-adrenoreceptor antagonists, intentional self-harm, sequela: Secondary | ICD-10-CM

## 2016-01-07 LAB — GLUCOSE, CAPILLARY: Glucose-Capillary: 118 mg/dL — ABNORMAL HIGH (ref 65–99)

## 2016-01-07 MED ORDER — LOPERAMIDE HCL 2 MG PO CAPS
4.0000 mg | ORAL_CAPSULE | ORAL | Status: DC | PRN
  Administered 2016-01-07: 4 mg via ORAL
  Filled 2016-01-07 (×2): qty 2

## 2016-01-07 MED ORDER — ENSURE ENLIVE PO LIQD: 237.0000 mL | Freq: Two times a day (BID) | ORAL | 12 refills | Status: AC | PRN

## 2016-01-07 NOTE — Discharge Instructions (Signed)
Suicidal Feelings: How to Help Yourself °Suicide is the taking of one's own life. If you feel as though life is getting too tough to handle and are thinking about suicide, get help right away. To get help: °· Call your local emergency services (911 in the [place] °· Call a suicide hotline to speak with a trained counselor who understands how you are feeling. The following is a list of suicide hotlines in the [place]. For a list of hotlines in [place], visit www.suicide.org/hotlines/international/[place]-suicide-hotlines.html. °¨ 1-800-273-TALK (1-800-273-8255). °¨ 1-800-SUICIDE (1-800-784-2433). °¨ 1-888-628-9454. This is a hotline for Spanish speakers. °¨ 1-800-799-4TTY (1-800-799-4889). This is a hotline for TTY users. °¨ 1-866-4-U-TREVOR (1-866-488-7386). This is a hotline for lesbian, gay, bisexual, transgender, or questioning youth. °· Contact a crisis center or a local suicide prevention center. To find a crisis center or suicide prevention center: °¨ Call your local hospital, clinic, community service organization, mental health center, social service provider, or health department. Ask for assistance in connecting to a crisis center. °¨ Visit www.suicidepreventionlifeline.org/getinvolved/locator for a list of crisis centers in the [place], or visit www.suicideprevention.ca/thinking-about-suicide/find-a-crisis-centre for a list of centers in [place]. °· Visit the following websites: °¨ National Suicide Prevention Lifeline: www.suicidepreventionlifeline.org °¨ Hopeline: www.hopeline.com °¨ American Foundation for Suicide Prevention: www.afsp.org °¨ The Trevor Project (for lesbian, gay, bisexual, transgender, or questioning youth): www.thetrevorproject.org °How can I help myself feel better? °· Promise yourself that you will not do anything drastic when you have suicidal feelings. Remember, there is hope. Many people have gotten through suicidal thoughts and feelings, and you will, too. You may have  gotten through them before, and this proves that you can get through them again. °· Let family, friends, teachers, or counselors know how you are feeling. Try not to isolate yourself from those who care about you. Remember, they will want to help you. Talk with someone every day, even if you do not feel sociable. Face-to-face conversation is best. °· Call a mental health professional and see one regularly. °· Visit your primary health care provider every year. °· Eat a well-balanced diet, and space your meals so you eat regularly. °· Get plenty of rest. °· Avoid alcohol and drugs, and remove them from your home. They will only make you feel worse. °· If you are thinking of taking a lot of medicine, give your medicine to someone who can give it to you one day at a time. If you are on antidepressants and are concerned you will overdose, let your health care provider know so he or she can give you safer medicines. Ask your mental health professional about the possible side effects of any medicines you are taking. °· Remove weapons, poisons, knives, and anything else that could harm you from your home. °· Try to stick to routines. Follow a schedule every day. Put self-care on your schedule. °· Make a list of realistic goals, and cross them off when you achieve them. Accomplishments give a sense of worth. °· Wait until you are feeling better before doing the things you find difficult or unpleasant. °· Exercise if you are able. You will feel better if you exercise for even a half hour each day. °· Go out in the sun or into nature. This will help you recover from depression faster. If you have a favorite place to walk, go there. °· Do the things that have always given you pleasure. Play your favorite music, read a good book, paint a picture, play your favorite instrument, or do anything   else that takes your mind off your depression if it is safe to do. °· Keep your living space well lit. °· When you are feeling well, write  yourself a letter about tips and support that you can read when you are not feeling well. °· Remember that life’s difficulties can be sorted out with help. Conditions can be treated. You can work on thoughts and strategies that serve you well. °This information is not intended to replace advice given to you by your health care provider. Make sure you discuss any questions you have with your health care provider. °Document Released: 08/08/2002 Document Revised: 10/01/2015 Document Reviewed: 05/29/2013 °Elsevier Interactive Patient Education © 2017 Elsevier Inc. ° °

## 2016-01-07 NOTE — Progress Notes (Signed)
Chaplain providing support with pt in response to spiritual care consult (suicidal ideation)  During chaplain visit, pt expressing active SI with plan stating, "If they let me out of here, I will go get a bottle of --- and some vodka"    "I don't know why they think they can stop me, they are just delaying it."   Flat affect, tearful.  Toward end of conversation she joked with chaplain and laughed briefly.   Chaplain provided emotional support at bedside.  Dana Wolfe described her recent overdose as response to s/o leaving her.  This individual had left her previously (8years ago) and Dana Wolfe wrestled with taking him back into her life (which she did after 6 months).  Expresses  some self-blame / guilt.   She describes him as significantly younger than she and states she thought he had matured.    Despondent and hopeless around finding companionship.  States her relationship with first husband did not work out and described "waiting 58 years" for this relationship.  Is not able to see her life outside of this relationship.  Though she identifies that the relationship is not healthy and helpful for her, she describes it as "an addiction," saying she knows she would go back if she could.    Dana Wolfe describes her hopelessness as "if it is not possible for me to be with anyone, I don't want to be here."    Dana Wolfe feels unseen by friends, who see her as "strong, independent woman" and she states they don't understand how lonely I am.  Reports she has lost several friend due to relationship with this s/o - says these friends did not wish to see her be hurt, so they left.     Dana Wolfe recognizes "I have been mean to people" and identifies being depressed.  States "a therapist once told me that depression is anger turned inward" and she agrees with this statement regarding herself.  States when I can't be mad enough at myself, I get mad at the world.  Is apologetic for being angry with nursing staff.      Previously,  Dana Wolfe enjoyed gardening and quilting and is a creative person.  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     Belva Crome MDiv

## 2016-01-07 NOTE — Progress Notes (Signed)
Follow up for continued support.  Dana Wolfe exhibits brighter affect today and is more future oriented than previous visit.  She states she had access to her phone and saw that her former s/o was not being supportive and resolved that he did not care for her.  She feels this was helpful for her to move toward thinking about what else she might do with her life.  She has been writing, which she also finds therapeutic.  Chaplain provided emotional support at bedside.     Belva Crome MDiv

## 2016-01-07 NOTE — Progress Notes (Signed)
[facility]  Plan of care reviewed, questions answered.  [facility] Lisabeth Mian A

## 2016-01-07 NOTE — Discharge Summary (Addendum)
Physician Discharge Summary  Dana MusaKaren Cotugno UJW:[id number] DOB: December 06, 1950 DOA: 01/02/2016  PCP: No primary care provider on file.  Admit date: 01/02/2016 Discharge date: 01/07/2016  Recommendations for Outpatient Follow-up:   [facility] medications per psychiatry   Discharge Diagnoses:  Principal Problem:   Suicide attempt by beta blocker overdose (HCC) Active Problems:   Bipolar 1 disorder, depressed, severe (HCC)    Discharge Condition: stable   Diet recommendation: as tolerated   History of present illness:   Per brief narrative 01/06/2016 "65 y.o. female with a history of Crohn's, bipolar disorder, fibromyalgia, and congenital limb deformities who was brought by EMS after a friend found her in her unresponsive at home after taking an intentional overdose of propranolol. In the ED she became bradycardic and hypotensive. IV fluid boluses were given, high dose insulin and D10 were administered. Epinephrine drip was started with improvement in pressures. This was weaned to off 11/19. She has continued to endorse suicidal ideation throughout this hospitalization."   Hospital Course:    Assessment & Plan:  Active suicidal ideation - Suicide precautions including sitter - Psych hold and dispo to psych facility when bed available. She is medically stable.   - Holding medications per psychiatry recommendation on 11/18  Lactate elevation - Resolved. No evidence of sepsis with normal WBC, afebrile  Electrolyte derangements / Hypokalemia, hypomagnesemia, and hypophosphatemia  - Resolved. Recheck intermittently.  Hypoglycemia - Related to large insulin doses in ICU. Resolved.  Crohn's disease - In apparent remission per pt, no records for review.  - No medications at home - Monitor  DVT prophylaxis: SCDs bilaterally  Code Status: Full Family Communication: Sitter at the bedside, no family at the bedside this am    Consultants:    Psychiatry  Procedures:   Echocardiogram 01/03/2016: EF 60%  Antimicrobials:  None    Signed:  Manson PasseyEVINE, Sybil Shrader, MD  [facility] 01/07/2016, 12:08 PM  Pager #: [phone]  Time spent in minutes: less than 30 minutes   Discharge Exam: Vitals:   01/06/16 1318 01/06/16 2020  BP: (!) 131/97 128/67  Pulse: 74 77  Resp: 16 17   Vitals:   01/06/16 0636 01/06/16 1318 01/06/16 2020 01/07/16 1100  BP: 129/77 (!) 131/97 128/67   Pulse: 72 74 77   Resp: 17 16 17    Temp: 97.7 F (36.5 C) 98.4 F (36.9 C) 98 F (36.7 C)   TempSrc: Oral Oral Oral   SpO2: 98% 97% 93%   Weight:  61.3 kg (135 lb 1.6 oz)  61.2 kg (135 lb)  Height:    5\' 4"  (1.626 m)    General: Pt is alert, follows commands appropriately, not in acute distress Cardiovascular: Regular rate and rhythm, S1/S2 +, no murmurs Respiratory: Clear to auscultation bilaterally, no wheezing, no crackles, no rhonchi Abdominal: Soft, non tender, non distended, bowel sounds +, no guarding Extremities: no edema, no cyanosis, pulses palpable bilaterally DP and PT Neuro: Grossly nonfocal  Discharge Instructions  Discharge Instructions    Call MD for:  persistant nausea and vomiting    Complete by:  As directed    Call MD for:  redness, tenderness, or signs of infection (pain, swelling, redness, odor or green/yellow discharge around incision site)    Complete by:  As directed    Call MD for:  severe uncontrolled pain    Complete by:  As directed    Diet - low sodium heart healthy    Complete by:  As directed  Increase activity slowly    Complete by:  As directed        Medication List    STOP taking these medications   gabapentin 300 MG capsule Commonly known as:  NEURONTIN   oxycodone 5 MG capsule Commonly known as:  OXY-IR   pramipexole 1.5 MG tablet Commonly known as:  MIRAPEX   primidone 50 MG tablet Commonly known as:  MYSOLINE   propranolol ER 120 MG 24 hr capsule Commonly known as:   INDERAL LA     TAKE these medications   feeding supplement (ENSURE ENLIVE) Liqd Take 237 mLs by mouth 2 (two) times daily as needed (As needed in setting of poor PO intake.).        Follow-up Information    Please use the resources provided to you in emergency room by case manager to assist you're your choice of doctor for follow up. Schedule an appointment as soon as possible for a visit.   Why:  A referral for you has been sent to [facility] if you have not received a call in 3 days you may contact them Call Tristina Sahagian at [phone] Tuesday-Friday [email or web address] Contact information: These [place] uninsured resources provide possible primary care providers, resources for discounted medications, housing, dental resources, affordable care act information, plus other resources for [facility]             The results of significant diagnostics from this hospitalization (including imaging, microbiology, ancillary and laboratory) are listed below for reference.    Significant Diagnostic Studies: Dg Chest 1 View  Result Date: 01/02/2016 CLINICAL DATA:  Unresponsive.  Possible overdose. EXAM: CHEST 1 VIEW COMPARISON:  05/01/2009 FINDINGS: Artifact overlies chest. Heart size is normal. Mediastinal shadows are normal. The lungs are clear. No effusions. Chronic spinal curvature. IMPRESSION: No active disease. Electronically Signed   By: Paulina Fusi M.D.   On: 01/02/2016 16:35   Ct Head Wo Contrast  Result Date: 01/02/2016 CLINICAL DATA:  Altered mental status EXAM: CT HEAD WITHOUT CONTRAST TECHNIQUE: Contiguous axial images were obtained from the base of the skull through the vertex without intravenous contrast. COMPARISON:  04/27/2009 FINDINGS: Brain: No evidence of acute or remote infarction, hemorrhage, hydrocephalus, extra-axial collection or mass lesion/mass effect. Vascular: No hyperdense vessel or unexpected calcification. Skull:  No acute or aggressive finding. Sinuses/Orbits: Mild mucosal edema in the ethmoid sinuses. IMPRESSION: No acute intracranial finding or change from 2011. Electronically Signed   By: Marnee Spring M.D.   On: 01/02/2016 16:40    Microbiology: Recent Results (from the past 240 hour(s))  MRSA PCR Screening     Status: None   Collection Time: 01/02/16  9:55 PM  Result Value Ref Range Status   MRSA by PCR NEGATIVE NEGATIVE Final    Comment:        The GeneXpert MRSA Assay (FDA approved for NASAL specimens only), is one component of a comprehensive MRSA colonization surveillance program. It is not intended to diagnose MRSA infection nor to guide or monitor treatment for MRSA infections.      Labs: Basic Metabolic Panel:  Recent Labs Lab 01/02/16 1536 01/02/16 2030 01/03/16 0320 01/04/16 0337 01/05/16 0432 01/06/16 0203  NA 143 140 140 140 143  --   K 4.3 2.5* 2.4* 3.2* 3.9  --   CL 111 117* 115* 114* 116*  --   CO2 23 20* 21* 19* 21*  --   GLUCOSE 102* 236* 121* 149* 106*  --  BUN 10 10 6  <5* <5*  --   CREATININE 1.09* 1.10* 0.95 0.91 0.82  --   CALCIUM 9.1 7.8* 7.8* 7.7* 8.0*  --   MG  --   --  1.4* 1.7 2.1 1.9  PHOS  --   --  <1.0* 2.4* 2.8 2.7   Liver Function Tests:  Recent Labs Lab 01/02/16 1536 01/03/16 0320  AST 28  --   ALT 28  --   ALKPHOS 84  --   BILITOT 1.5*  --   PROT 6.8  --   ALBUMIN 4.6 2.9*   No results for input(s): LIPASE, AMYLASE in the last 168 hours.  Recent Labs Lab 01/02/16 1536  AMMONIA 32   CBC:  Recent Labs Lab 01/02/16 1536 01/03/16 0320 01/04/16 0337 01/05/16 0432 01/06/16 0203  WBC 11.1* 11.0* 11.7* 8.8 7.2  NEUTROABS 7.2 7.5 8.3* 5.8 4.1  HGB 15.9* 12.6 13.5 13.3 12.6  HCT 46.8* 36.9 39.6 40.1 37.2  MCV 84.8 84.4 83.5 86.1 84.5  PLT 346 341 276 251 217   Cardiac Enzymes:  Recent Labs Lab 01/04/16 0337  TROPONINI <0.03   BNP: BNP (last 3 results) No results for input(s): BNP in the last 8760  hours.  ProBNP (last 3 results) No results for input(s): PROBNP in the last 8760 hours.  CBG:  Recent Labs Lab 01/05/16 0924 01/05/16 1146 01/05/16 1512 01/05/16 1723 01/07/16 0745  GLUCAP 99 99 118* 126* 118*

## 2016-01-07 NOTE — Progress Notes (Signed)
Patient clinical information faxed again to inpatient psychiatric facilities,  [facility] [place]  Will inform medical staff when bed available.

## 2016-01-07 NOTE — Progress Notes (Signed)
Patient has been accepted to [facility]. Attending :Garey Ham confirmed Transport between 3:00-7:00pm tonight with [facility]. LCSWA provided nurse Marissa number, transport will call before arrival.  Patient and daughter notified. Nurse Given Report.[phone]  Vivi Barrack, Theresia Majors, MSW Clinical Social Worker [facility]E and [facility] Service Line [phone] 01/07/2016  1:16 PM

## 2018-07-16 IMAGING — CT CT HEAD W/O CM
4 series · 16 of 47 positions shown, 18 images · non-contrast
Comparison: 04/27/2009

CLINICAL DATA: Altered mental status

EXAM:
CT HEAD WITHOUT CONTRAST
TECHNIQUE: Contiguous axial images were obtained from the base of the skull
through the vertex without intravenous contrast.

[Series 2: head w/o · axial · non-contrast · 0.42mm/px · z∈[+1224,+1339]mm · 7 of 31 slices shown, 9 images]
[im 4/31  brain]
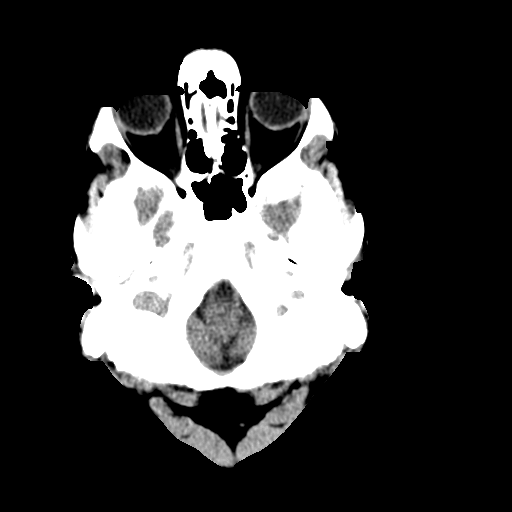
[im 4/31  bone]
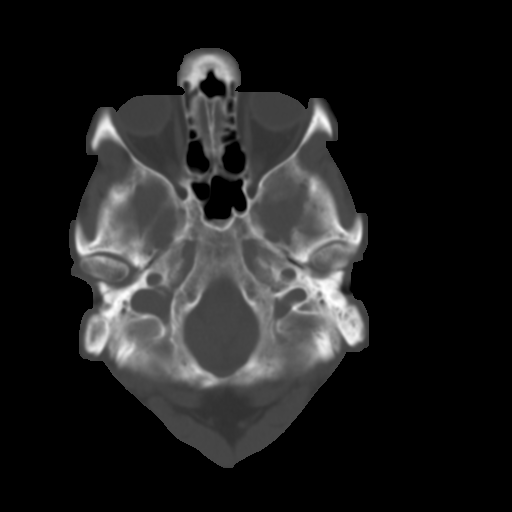
[im 8/31  brain]
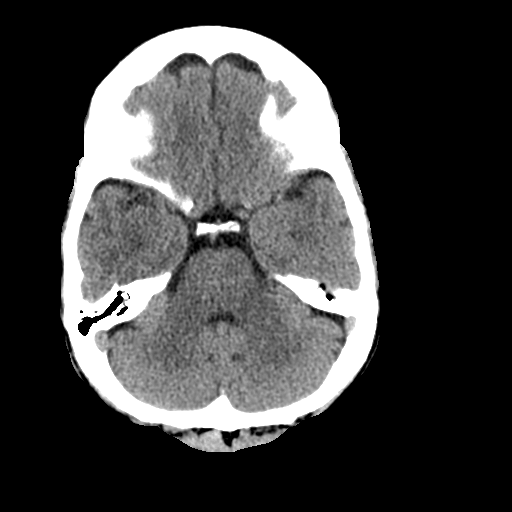
[im 12/31  brain]
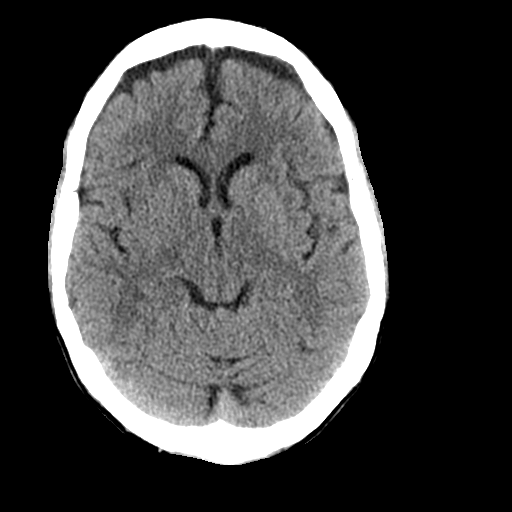
[im 16/31  brain]
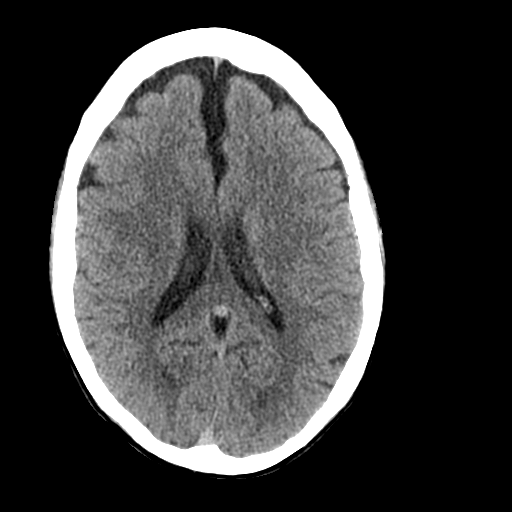
[im 19/31  brain]
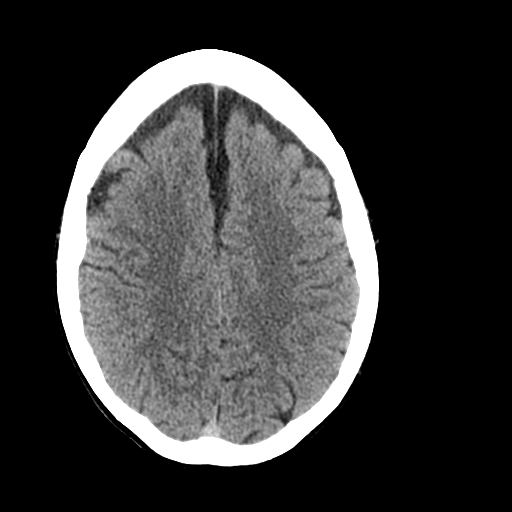
[im 19/31  bone]
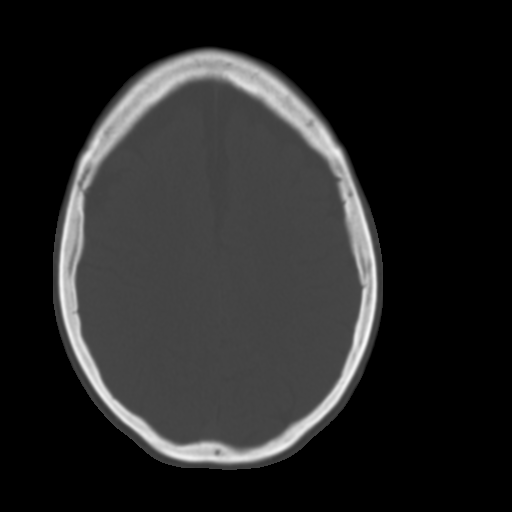
[im 23/31  brain]
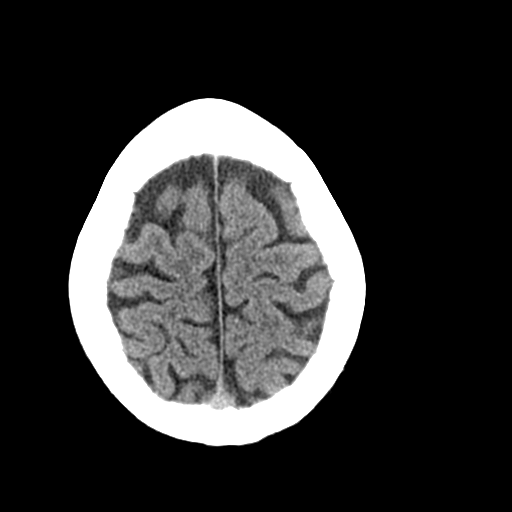
[im 27/31  brain]
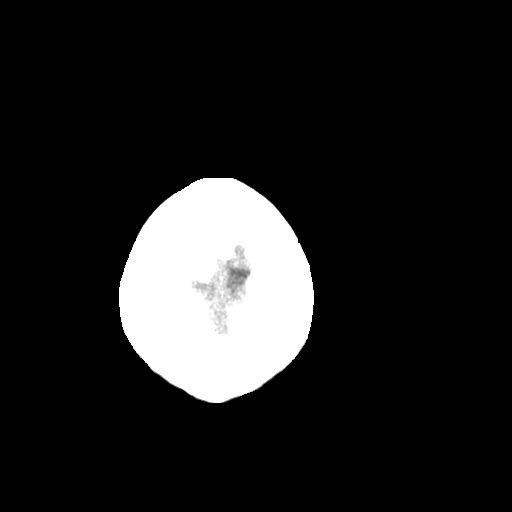

[Series 3: bone windows · axial · 0.42mm/px · z∈[+1223,+1255]mm · 3 of 78 slices shown]
[im 8/78  bone]
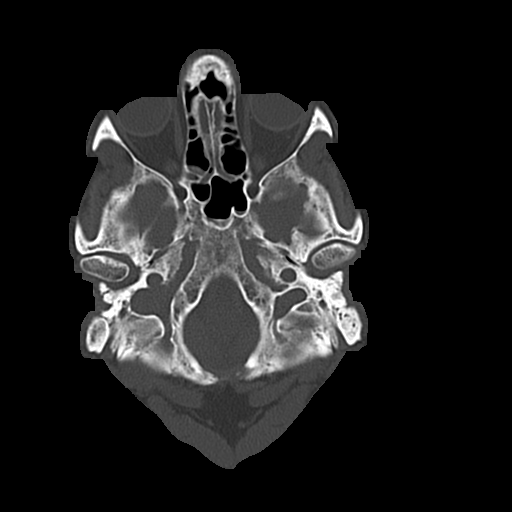
[im 16/78  bone]
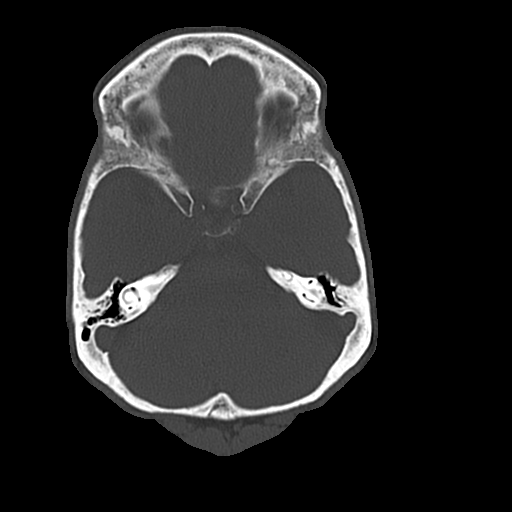
[im 24/78  bone]
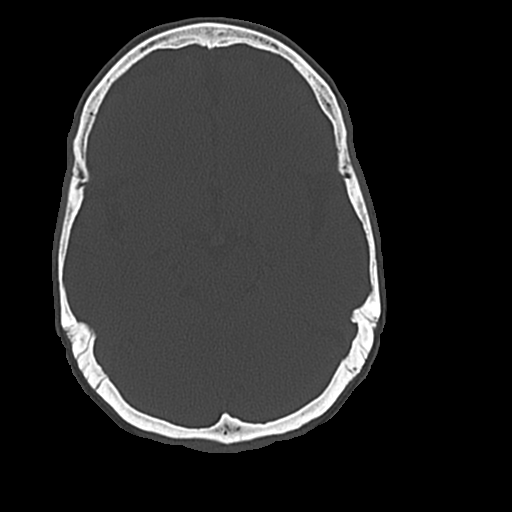

[Series 4: coronal · coronal · 0.30mm/px · 3 of 77 slices shown]
[im 26/77  brain]
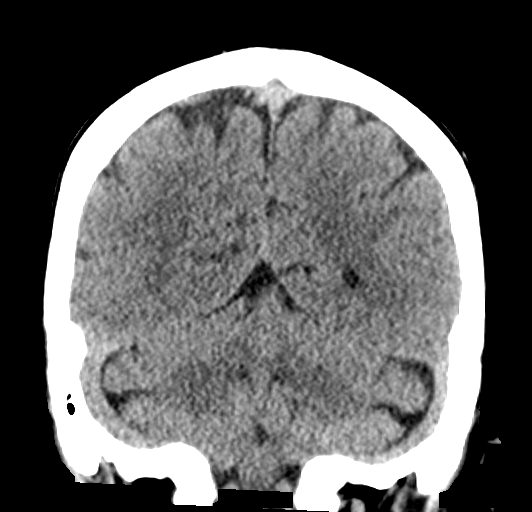
[im 34/77  brain]
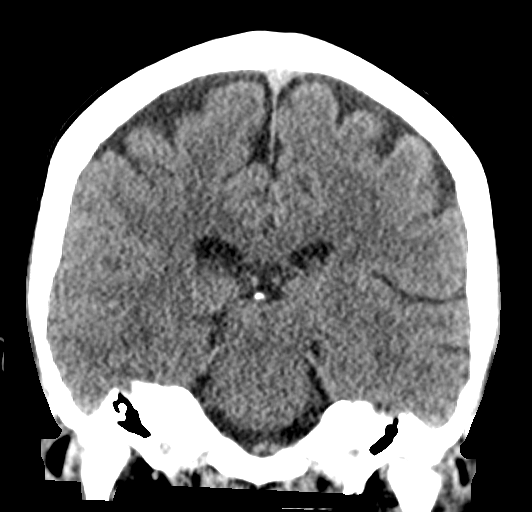
[im 43/77  brain]
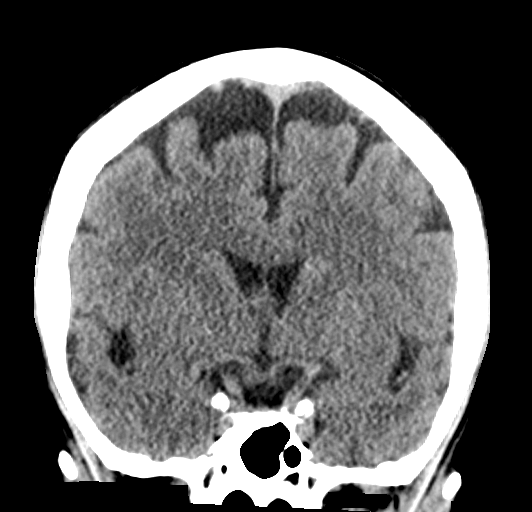

[Series 5: sagittal · sagittal · 0.30mm/px · 3 of 54 slices shown]
[im 18/54  brain]
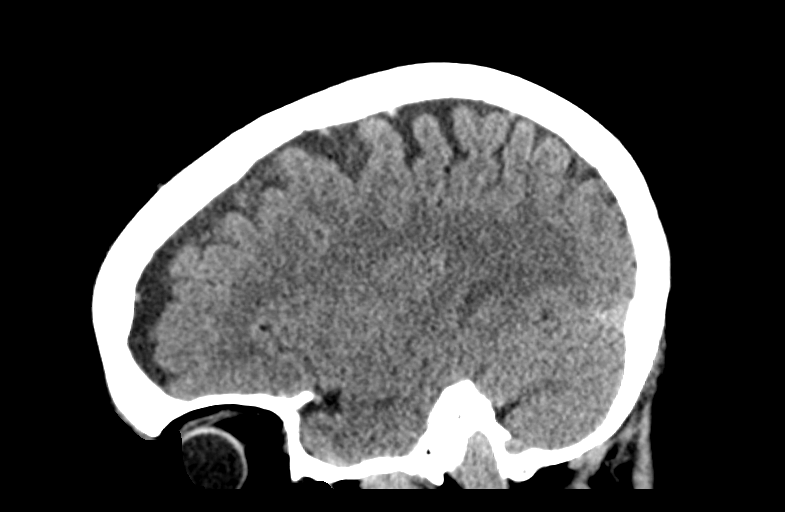
[im 27/54  brain]
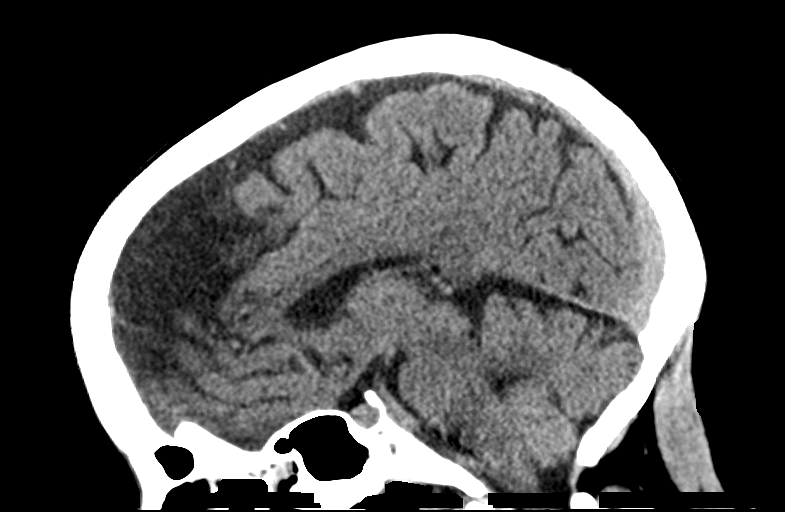
[im 36/54  brain]
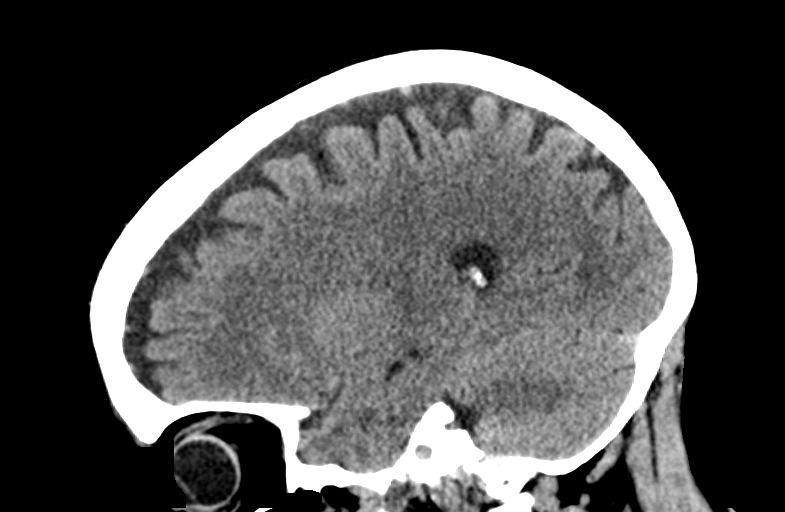

[16 of 47 positions shown; findings below may reference images not displayed]

FINDINGS: Brain: No evidence of acute or remote infarction, hemorrhage,
hydrocephalus, extra-axial collection or mass lesion/mass effect.

Vascular: No hyperdense vessel or unexpected calcification.

Skull: No acute or aggressive finding.

Sinuses/Orbits: Mild mucosal edema in the ethmoid sinuses.
IMPRESSION: No acute intracranial finding or change from 9699.
